# Patient Record
Sex: Female | Born: 1970 | Race: Black or African American | Hispanic: No | Marital: Married | Smoking: Never smoker
Health system: Southern US, Community
[De-identification: ages and names within clinical notes are randomized; demographics above are authoritative.]

## PROBLEM LIST (undated history)

## (undated) DIAGNOSIS — D649 Anemia, unspecified: Secondary | ICD-10-CM

## (undated) DIAGNOSIS — D219 Benign neoplasm of connective and other soft tissue, unspecified: Secondary | ICD-10-CM

## (undated) HISTORY — PX: BREAST SURGERY: SHX581

## (undated) HISTORY — DX: Anemia, unspecified: D64.9

## (undated) HISTORY — PX: BREAST EXCISIONAL BIOPSY: SUR124

## (undated) HISTORY — DX: Benign neoplasm of connective and other soft tissue, unspecified: D21.9

---

## 2015-12-23 DIAGNOSIS — D259 Leiomyoma of uterus, unspecified: Secondary | ICD-10-CM | POA: Diagnosis not present

## 2015-12-31 DIAGNOSIS — N888 Other specified noninflammatory disorders of cervix uteri: Secondary | ICD-10-CM | POA: Diagnosis not present

## 2015-12-31 DIAGNOSIS — N852 Hypertrophy of uterus: Secondary | ICD-10-CM | POA: Diagnosis not present

## 2015-12-31 DIAGNOSIS — D259 Leiomyoma of uterus, unspecified: Secondary | ICD-10-CM | POA: Diagnosis not present

## 2016-01-20 ENCOUNTER — Ambulatory Visit (INDEPENDENT_AMBULATORY_CARE_PROVIDER_SITE_OTHER): Payer: BLUE CROSS/BLUE SHIELD | Admitting: Obstetrics & Gynecology

## 2016-01-20 ENCOUNTER — Encounter: Payer: Self-pay | Admitting: Obstetrics & Gynecology

## 2016-01-20 VITALS — BP 126/70 | HR 66 | Resp 14 | Ht 65.0 in | Wt 197.0 lb

## 2016-01-20 DIAGNOSIS — N852 Hypertrophy of uterus: Secondary | ICD-10-CM

## 2016-01-20 DIAGNOSIS — Z01419 Encounter for gynecological examination (general) (routine) without abnormal findings: Secondary | ICD-10-CM

## 2016-01-20 DIAGNOSIS — Z124 Encounter for screening for malignant neoplasm of cervix: Secondary | ICD-10-CM | POA: Diagnosis not present

## 2016-01-20 DIAGNOSIS — R87610 Atypical squamous cells of undetermined significance on cytologic smear of cervix (ASC-US): Secondary | ICD-10-CM | POA: Diagnosis not present

## 2016-01-20 NOTE — Progress Notes (Signed)
45 y.o. [id number] MarriedAfrican AmericanF here for new patient exam.  H/O enlarged uterine fibroids.  Pt has hx of smaller fibroids since at least 2006 but over the last few years, there has been significant change and growth.  Pt reports the last two years she has really noticed changes.  Patient's last menstrual period was 01/09/2016.          Sexually active: Yes.    The current method of family planning is none.  Pt has not been SA since before her last menstrual cycle. Exercising: No.  The patient does not participate in regular exercise at present. Smoker:  no  Health Maintenance: Pap:  ~ 2015 Normal  History of abnormal Pap:  no MMG:  11/2013 Dense breast. F/u normal  Colonoscopy:  Never BMD:   Never TDaP:  2009 Screening Labs: PCP, Urine today:    reports that she has never smoked. She has never used smokeless tobacco. She reports that she drinks about 1.2 oz of alcohol per week. She reports that she does not use illicit drugs.  Past Medical History  Diagnosis Date  . Anemia   . Fibroid    Past Surg Hx: Breast fibroma removal x 2  Current Outpatient Prescriptions  Medication Sig Dispense Refill  . Dextromethorphan Polistirex (DELSYM PO) Take by mouth daily as needed.    . loratadine (CLARITIN) 10 MG tablet Take 10 mg by mouth daily.    . Multiple Vitamin (MULTIVITAMIN) tablet Take 1 tablet by mouth daily.     No current facility-administered medications for this visit.    Family History  Problem Relation Age of Onset  . Hypertension Father   . Prostate cancer Father   . Breast cancer Maternal Aunt   . Breast cancer Paternal Aunt   . Diabetes Maternal Grandmother   . Cancer Maternal Aunt     ROS:  Pertinent items are noted in HPI.  Otherwise, a comprehensive ROS was negative.  Exam:   BP 126/70 mmHg  Pulse 66  Resp 14  Ht 5\' 5"  (1.651 m)  Wt 197 lb (89.359 kg)  BMI 32.78 kg/m2  LMP 01/09/2016    Height: 5\' 5"  (165.1 cm)  Ht Readings from Last 3 Encounters:   01/20/16 5\' 5"  (1.651 m)    General appearance: alert, cooperative and appears stated age Head: Normocephalic, without obvious abnormality, atraumatic Neck: no adenopathy, supple, symmetrical, trachea midline and thyroid normal to inspection and palpation Lungs: clear to auscultation bilaterally Breasts: normal appearance, no masses or tenderness Heart: regular rate and rhythm Abdomen: soft, non-tender; bowel sounds normal; no masses,  no organomegaly Extremities: extremities normal, atraumatic, no cyanosis or edema Skin: Skin color, texture, turgor normal. No rashes or lesions Lymph nodes: Cervical, supraclavicular, and axillary nodes normal. No abnormal inguinal nodes palpated Neurologic: Grossly normal   Pelvic: External genitalia:  no lesions              Urethra:  normal appearing urethra with no masses, tenderness or lesions              Bartholins and Skenes: normal                 Vagina: normal appearing vagina with normal color and discharge, no lesions              Cervix: no lesions              Pap taken: Yes.   Bimanual Exam:  Uterus:  enlarged, 20-22 weeks  with about 15cm fibroid noted weeks size              Adnexa: normal adnexa and no mass, fullness, tenderness               Rectovaginal: Confirms               Anus:  normal sphincter tone, no lesions  Endometrial biopsy recommended.  Verbal consent obtained.  Procedure:  Speculum placed.  Cervix visualized and cleansed with betadine prep.  A single toothed tenaculum was applied to the anterior lip of the cervix.  Endometrial pipelle was advanced through the cervix into the endometrial cavity without difficulty.  Pipelle passed to 11cm.  Suction applied and pipelle removed with good tissue sample obtained.  Tenculum removed.  No bleeding noted.  Patient tolerated procedure well.  Chaperone was present for exam.  A:  Well Woman with normal exam Enlarged uterus, consistent with fibroids, symptomatic H/o breast  fibroma  P:   Mammogram information provided pap smear with HR HPV obtained today Endometrial biopsy pending Referral to IR for [place].  Pt will need MRI and is aware of this.  Ready to proceed. Release of records for ultrasound result and most recent Pap signed today. return annually or prn  In addition to exam, about 15 additional minutes spent discussing options and evaluation.

## 2016-01-24 ENCOUNTER — Telehealth: Payer: Self-pay | Admitting: Obstetrics & Gynecology

## 2016-01-24 ENCOUNTER — Other Ambulatory Visit: Payer: Self-pay | Admitting: Obstetrics & Gynecology

## 2016-01-24 DIAGNOSIS — N852 Hypertrophy of uterus: Secondary | ICD-10-CM

## 2016-01-24 DIAGNOSIS — D259 Leiomyoma of uterus, unspecified: Secondary | ICD-10-CM

## 2016-01-24 NOTE — Telephone Encounter (Signed)
Left patient a voicemail regarding referral. Note in referral and inbox to [place] with information.

## 2016-01-25 NOTE — Telephone Encounter (Signed)
Patient returned call. Patient is agreeable to seeing Dr Kathlene Cote at his first available appointment. Advised patient I will notify Jocelyn Lamer at [facility] and Jocelyn Lamer will contact her to schedule appointment.

## 2016-01-26 LAB — IPS PAP TEST WITH HPV

## 2016-01-27 ENCOUNTER — Telehealth: Payer: Self-pay | Admitting: Emergency Medicine

## 2016-01-27 NOTE — Telephone Encounter (Signed)
Patient notified of results and verbalized understanding.  Has appointment with [facility] imaging 02/22/16.  Will follow up as scheduled.  Routing to provider for final review. Patient agreeable to disposition. Will close encounter.

## 2016-01-27 NOTE — Telephone Encounter (Signed)
-----   Message from Megan Salon, MD sent at 01/25/2016 12:58 PM EDT ----- Please inform pt endometrial biopsy was negative for abnormal cells.

## 2016-02-03 NOTE — Telephone Encounter (Signed)
Debbie Bond returned call. She no longer needs a copy of a pap smear, most recent results will suffice.  She does not radiologist report for pelvic ultrasound that as completed 12/31/15 at [facility].  Call to [facility] [place] and left messgage with medical records requesting radiology report to be faxed to our office.

## 2016-02-03 NOTE — Telephone Encounter (Signed)
Debbie Bond with [facility] calling to get a copy of patient's ultrasound report and pap smear from her previous providers office.

## 2016-02-03 NOTE — Telephone Encounter (Signed)
Call to [facility]. There is a recent pap smear from her office visit that is in [place].  Also, ultrasound that was completed 01/2016 is scanned under media tab.  Left message to see if these records will suffice.   Also, call to patient previous provider office, advised that some records were received but need pap smear result if they are available. Left message with request that pap results be faxed.

## 2016-02-03 NOTE — Telephone Encounter (Signed)
[facility] returning call. Patient's last pap was in 2015 which is the one that was faxed to our office.

## 2016-02-16 ENCOUNTER — Other Ambulatory Visit (HOSPITAL_COMMUNITY): Payer: Self-pay | Admitting: Interventional Radiology

## 2016-02-16 ENCOUNTER — Ambulatory Visit
Admission: RE | Admit: 2016-02-16 | Discharge: 2016-02-16 | Disposition: A | Discharge status: Home / Self Care | Payer: BLUE CROSS/BLUE SHIELD | Source: Ambulatory Visit | Attending: Obstetrics & Gynecology | Admitting: Obstetrics & Gynecology

## 2016-02-16 DIAGNOSIS — D259 Leiomyoma of uterus, unspecified: Secondary | ICD-10-CM | POA: Diagnosis not present

## 2016-02-16 DIAGNOSIS — N852 Hypertrophy of uterus: Secondary | ICD-10-CM

## 2016-02-16 HISTORY — PX: IR GENERIC HISTORICAL: IMG1180011

## 2016-02-16 NOTE — Consult Note (Signed)
Chief Complaint: Patient was seen in consultation today for uterine fibroid embolization at the request of Miller,Mary S.  Referring Physician(s): Miller,Mary S  History of Present Illness: Debbie Bond is a 45 y.o. [id number] African American female with a history of known uterine fibroids since approximately 2006-2007. She states that ultrasound at that time showed roughly 2 small fibroids. She has had vaginal increase in uterine size and fibroids, especially noticeable over the last 7 months. She states that her lower abdomen has become more protuberant due to fibroid enlargement. Symptoms related to fibroid disease include menorrhagia and dysmenorrhea. She has regular periods roughly every 28 days that last 3-4 days with 2-3 days of heavy bleeding and rarely some clot passage. She has not been significantly anemic. She does not have any interperiod bleeding. Last menstrual cycle was 02/06/2016.  Bulk symptoms are also present with increased urinary frequency, urgency and nocturia. She voids many times during the day and wakes up approximately twice a night to void. She also has abdominal and pelvic pressure symptoms as well as bloating. She has not had any previous fibroid surgery and is not on any hormonal therapy. She has no history of gynecologic infections. Endometrial biopsy on 01/20/2016 showed no evidence of hyperplasia or malignancy. Pap smear on 01/20/2016 demonstrated atypical squamous cells of undetermined significance. No high risk HPV detected.  Mrs. Debbie Bond is not interested in future childbirth. She works full time as a Proofreader.  Past Medical History  Diagnosis Date  . Anemia   . Fibroid     Past Surgical History  Procedure Laterality Date  . Breast surgery Left 2003, 2005    Removal of fibroma    Allergies: Latex and Penicillins  Medications: Prior to Admission medications   Medication Sig Start Date End Date Taking?  Authorizing Provider  Dextromethorphan Polistirex (DELSYM PO) Take by mouth daily as needed.    Historical Provider, MD  loratadine (CLARITIN) 10 MG tablet Take 10 mg by mouth daily.    Historical Provider, MD  Multiple Vitamin (MULTIVITAMIN) tablet Take 1 tablet by mouth daily.    Historical Provider, MD     Family History  Problem Relation Age of Onset  . Hypertension Father   . Prostate cancer Father   . Breast cancer Maternal Aunt   . Breast cancer Paternal Aunt   . Diabetes Maternal Grandmother   . Cancer Maternal Aunt     Social History   Social History  . Marital Status: Married    Spouse Name: N/A  . Number of Children: N/A  . Years of Education: N/A   Social History Main Topics  . Smoking status: Never Smoker   . Smokeless tobacco: Never Used  . Alcohol Use: 1.2 oz/week    2 Glasses of wine per week  . Drug Use: No  . Sexual Activity: Yes    Birth Control/ Protection: None   Other Topics Concern  . Not on file   Social History Narrative  . No narrative on file    Review of Systems: A 12 point ROS discussed and pertinent positives are indicated in the HPI above.  All other systems are negative.  Review of Systems  Constitutional: Negative.   HENT: Negative.   Respiratory: Negative.   Cardiovascular: Negative.   Gastrointestinal: Positive for abdominal distention. Negative for nausea, vomiting, abdominal pain, diarrhea, constipation, blood in stool, anal bleeding and rectal pain.  Endocrine: Negative.  Genitourinary: Positive for urgency, frequency, menstrual problem and pelvic pain. Negative for dysuria, hematuria, flank pain, decreased urine volume, vaginal discharge, enuresis, difficulty urinating, genital sores, vaginal pain and dyspareunia.  Musculoskeletal: Negative.   Skin: Negative.   Neurological: Negative.   Hematological: Negative.     Vital Signs: BP 131/85 mmHg  Pulse 82  Temp(Src) 97.5 F (36.4 C) (Oral)  Resp 14  Ht 5' 5"  (1.651 m)   Wt 197 lb (89.359 kg)  BMI 32.78 kg/m2  SpO2 98%  LMP 02/06/2016 (Exact Date)  Physical Exam  Constitutional: She appears well-developed and well-nourished. No distress.  HENT:  Head: Normocephalic and atraumatic.  Neck: Neck supple. No JVD present. No tracheal deviation present. No thyromegaly present.  Cardiovascular: Normal rate, regular rhythm, normal heart sounds and intact distal pulses.  Exam reveals no gallop and no friction rub.   No murmur heard. Pulmonary/Chest: Effort normal and breath sounds normal. No stridor. No respiratory distress. She has no wheezes. She has no rales.  Abdominal: Soft. Bowel sounds are normal. There is no tenderness. There is no rebound and no guarding.  Palpable enlarged uterus with focal palpable anterior just below the umbilicus.  Musculoskeletal: She exhibits no edema or tenderness.  Lymphadenopathy:    She has no cervical adenopathy.  Neurological: She is alert.  Skin: Skin is warm and dry. No rash noted. She is not diaphoretic. No erythema.  Psychiatric: She has a normal mood and affect.  Nursing note and vitals reviewed.    Imaging: Copied images from a prior pelvic ultrasound at [employer] [facility] [employer] dated 12/31/2015 were reviewed. This demonstrates uterine enlargement with estimated dimensions of approximately 19.4 x 10.2 x 15.5 cm (estimated volume of 1595 mL). A dominant fundal fibroid was measured at 11.9 x 9.0 x 11.6 cm. There appear to be other smaller fibroids.  Image quality of copied images is poor and the report from the exam is not available.  Labs:  CBC: No results for input(s): WBC, HGB, HCT, PLT in the last 8760 hours.  COAGS: No results for input(s): INR, APTT in the last 8760 hours.  BMP: No results for input(s): NA, K, CL, CO2, GLUCOSE, BUN, CALCIUM, CREATININE, GFRNONAA, GFRAA in the last 8760 hours.  Invalid input(s): CMP  LIVER FUNCTION TESTS: No results for input(s): BILITOT, AST, ALT, ALKPHOS, PROT,  ALBUMIN in the last 8760 hours.  Assessment and Plan:  I met with Mrs. Debbie Bond. We reviewed prior ultrasound findings and physical exam findings which show an enlarged, 20-22 week uterus with a palpable dominant fundal fibroid. Uterine volume has enlarged over time and the patient has significant bulk symptoms and menorrhagia.  There is currently a clinical indication for hysterectomy. The patient wanted to discuss the possibility of uterine fibroid embolization as she has had some friends in [place] have the procedure performed with good results.  Alternatives to embolization were also discussed with the patient including hysterectomy.   I reviewed details of uterine fibroid embolization including technical details, outcomes and risks. We discussed the need for pre-procedural MRI evaluation of the pelvis with gadolinium in order to more accurately delineate fibroid morphology and enhancement pattern. Fibroid morphology would determine if the patient is a good candidate for embolization. The patient is attracted to embolization due to her job and shorter recovery time over the summer. If she is a candidate, she would like to target late June to have the procedure done as she will be finishing a project at that time.  Outpatient  MRI will be arranged. Mrs. Yomara Toothman is interested in meeting back with me to review the MRI images in person.  Once the MRI is reviewed, we will begin the scheduling and authorization process if she is a candidate for embolization and would like to proceed. The procedure is performed [facility] with overnight observation afterwards.  Thank you for this interesting consult.  I greatly enjoyed meeting Lavana Huckeba and look forward to participating in their care.  A copy of this report was sent to the requesting provider on this date.   Electronically SignedAletta Edouard T 02/16/2016, 11:38 AM   I spent a total of 40 Minutes in face to face  in clinical consultation, greater than 50% of which was counseling/coordinating care for symptomatic uterine fibroids.

## 2016-02-18 ENCOUNTER — Ambulatory Visit (HOSPITAL_COMMUNITY)
Admission: RE | Admit: 2016-02-18 | Discharge: 2016-02-18 | Disposition: A | Discharge status: Home / Self Care | Payer: BLUE CROSS/BLUE SHIELD | Source: Ambulatory Visit | Attending: Interventional Radiology | Admitting: Interventional Radiology

## 2016-02-18 DIAGNOSIS — D251 Intramural leiomyoma of uterus: Secondary | ICD-10-CM | POA: Insufficient documentation

## 2016-02-18 DIAGNOSIS — D259 Leiomyoma of uterus, unspecified: Secondary | ICD-10-CM | POA: Diagnosis not present

## 2016-02-18 MED ORDER — GADOBENATE DIMEGLUMINE 529 MG/ML IV SOLN
20.0000 mL | Freq: Once | INTRAVENOUS | Status: AC | PRN
  Administered 2016-02-18: 17 mL via INTRAVENOUS

## 2016-02-21 NOTE — Telephone Encounter (Signed)
Imaging report is scanned into EPIC.  Will close encounter.

## 2016-02-22 ENCOUNTER — Other Ambulatory Visit: Payer: Self-pay

## 2016-02-22 ENCOUNTER — Ambulatory Visit (HOSPITAL_COMMUNITY): Payer: BLUE CROSS/BLUE SHIELD

## 2016-02-22 ENCOUNTER — Other Ambulatory Visit: Payer: BLUE CROSS/BLUE SHIELD

## 2016-03-15 ENCOUNTER — Ambulatory Visit
Admission: RE | Admit: 2016-03-15 | Discharge: 2016-03-15 | Disposition: A | Discharge status: Home / Self Care | Payer: BLUE CROSS/BLUE SHIELD | Source: Ambulatory Visit | Attending: Interventional Radiology | Admitting: Interventional Radiology

## 2016-03-15 DIAGNOSIS — D259 Leiomyoma of uterus, unspecified: Secondary | ICD-10-CM

## 2016-03-15 NOTE — Progress Notes (Signed)
Chief Complaint: Symptomatically uterine fibroids.  History of Present Illness: Debbie Bond is a 45 y.o. female seen in consultation on 02/16/2016 for possible uterine fibroid embolization. Since that time, MRI of the pelvis has been performed on 02/17/2017 and she returns to review imaging and discuss possible embolization.  Past Medical History  Diagnosis Date  . Anemia   . Fibroid     Past Surgical History  Procedure Laterality Date  . Breast surgery Left 2003, 2005    Removal of fibroma    Allergies: Latex and Penicillins  Medications: Prior to Admission medications   Medication Sig Start Date End Date Taking? Authorizing Provider  Dextromethorphan Polistirex (DELSYM PO) Take by mouth daily as needed. Reported on 03/15/2016   Yes Historical Provider, MD  loratadine (CLARITIN) 10 MG tablet Take 10 mg by mouth daily.   Yes Historical Provider, MD  Multiple Vitamin (MULTIVITAMIN) tablet Take 1 tablet by mouth daily.   Yes Historical Provider, MD     Family History  Problem Relation Age of Onset  . Hypertension Father   . Prostate cancer Father   . Breast cancer Maternal Aunt   . Breast cancer Paternal Aunt   . Diabetes Maternal Grandmother   . Cancer Maternal Aunt     Social History   Social History  . Marital Status: Married    Spouse Name: N/A  . Number of Children: N/A  . Years of Education: N/A   Social History Main Topics  . Smoking status: Never Smoker   . Smokeless tobacco: Never Used  . Alcohol Use: 1.2 oz/week    2 Glasses of wine per week  . Drug Use: No  . Sexual Activity: Yes    Birth Control/ Protection: None   Other Topics Concern  . Not on file   Social History Narrative  . No narrative on file     Review of Systems  Vital Signs: BP 131/85 mmHg  Pulse 80  Temp(Src) 98.2 F (36.8 C) (Oral)  Resp 14  Wt 195 lb (88.451 kg)  SpO2 100%  LMP 03/04/2016 (Exact Date)  Physical Exam   Imaging: Mr Pelvis W Wo  Contrast  02/18/2016  CLINICAL DATA:  Pre uterine artery embolization. Lower abdominal was from protuberant. EXAM: MRI PELVIS WITHOUT AND WITH CONTRAST TECHNIQUE: Multiplanar multisequence MR imaging of the pelvis was performed both before and after administration of intravenous contrast. CONTRAST:  78m MULTIHANCE GADOBENATE DIMEGLUMINE 529 MG/ML IV SOLN COMPARISON:  None. FINDINGS: Uterus: Including the large exophytic leiomyoma the TOTAL URINE VOLUME EQUALS 19.1 X 13.7 x 9.6 cm. Excluding the large exophytic leiomyoma extending from the fundus, the uterus measures 6.2 by 8.3 by 5.5 cm. The junctional zone is normal thickness at 7 mm. Dominant exophytic leiomyoma extending from the uterine fundus measuring 13 cm in craniocaudad dimension and 13.7 x 9.6 cm in axial dimension (image 12, series 6). He dominant exophytic leiomyoma enhances uniformly and extends above the level of the umbilicus. Smaller intramural leiomyomas are present in the uterine body ranging in size from 1.72 to 2.5 cm and numbering 4. No submucosal leiomyomas. Additional exophytic leiomyoma extends from anterior LEFT uterine body measuring 5.3 by 3.7 by 4.6 cm (image 19, series 4). These additional intramural and exophytic leiomyomas enhance uniformly. Endometrium: Normal thickness at 6 mm. Cervix/Vagina: Normal Right Ovary: Normal size with small follicles measuring 3.6 cm in greatest dimension. Left Ovary: Normal size with small follicles measuring 3.7 cm in greatest dimension. Other: The bladder  and rectum are normal. Vascular: Normal arterial vascular anatomy to the ovaries and uterus. IMPRESSION: 1. Dominant exophytic leiomyoma extending from the uterine fundus measures up to 13 cm and extends above the umbilicus. Leiomyoma enhances uniformly. 2. Additional smaller exophytic leiomyoma multiple intramural leiomyomas. 3. Normal junctional zone. 4. No variant arterial anatomy identified . Electronically Signed   By: Suzy Bouchard M.D.   On:  02/18/2016 08:48    Assessment and Plan:  I met with Mrs. Debbie Bond to review MRI findings. This shows significant uterine enlargement with uterine dimensions by my measurement of approximately 20.7 x 9.7 x 14.5 cm (uterine volume of approximately 1514 mL). A dominant fibroid emanating from the top of the uterine fundus measures approximately 13.7 x 9.7 x 13.8 cm. This shows attachment to the top of the uterine fundus. Second largest fibroid is an anterior fibroid measuring approximately 5.4 cm in greatest diameter. There are at least an additional 6 smaller fibroids present.  Fibroid morphology appears amenable to embolization. I did tell Mrs. Debbie Bond that post embolization inflammation with associated cramping pain could be more severe given the large volume of fibroid tissue present. This can be aggressively treated with anti-inflammatory medication and pain medicine during post procedural hospitalization. I would still anticipate that she would be able to go home the day after the procedure. After discussion, Mrs. Debbie Bond would like to proceed with scheduling fibroid embolization. We will begin the scheduling process.    Electronically SignedAletta Edouard T 03/15/2016, 1:29 PM   I spent a total of 15 Minutes in face to face in clinical consultation, greater than 50% of which was counseling/coordinating care for fibroid embolization.

## 2016-03-17 ENCOUNTER — Other Ambulatory Visit: Payer: Self-pay | Admitting: Interventional Radiology

## 2016-03-17 DIAGNOSIS — D259 Leiomyoma of uterus, unspecified: Secondary | ICD-10-CM

## 2016-04-05 ENCOUNTER — Ambulatory Visit (HOSPITAL_COMMUNITY): Payer: BLUE CROSS/BLUE SHIELD

## 2016-04-05 ENCOUNTER — Other Ambulatory Visit (HOSPITAL_COMMUNITY): Payer: BLUE CROSS/BLUE SHIELD

## 2016-05-03 ENCOUNTER — Other Ambulatory Visit: Payer: Self-pay | Admitting: Radiology

## 2016-05-04 ENCOUNTER — Other Ambulatory Visit: Payer: Self-pay | Admitting: General Surgery

## 2016-05-04 ENCOUNTER — Other Ambulatory Visit: Payer: Self-pay | Admitting: Radiology

## 2016-05-05 ENCOUNTER — Other Ambulatory Visit: Payer: Self-pay | Admitting: Interventional Radiology

## 2016-05-05 ENCOUNTER — Ambulatory Visit (HOSPITAL_COMMUNITY)
Admission: RE | Admit: 2016-05-05 | Discharge: 2016-05-05 | Disposition: A | Discharge status: Home / Self Care | Payer: BLUE CROSS/BLUE SHIELD | Source: Ambulatory Visit | Attending: Interventional Radiology | Admitting: Interventional Radiology

## 2016-05-05 ENCOUNTER — Observation Stay (HOSPITAL_COMMUNITY)
Admission: RE | Admit: 2016-05-05 | Discharge: 2016-05-06 | Disposition: A | Discharge status: Home / Self Care | Payer: BLUE CROSS/BLUE SHIELD | Source: Ambulatory Visit | Attending: Interventional Radiology | Admitting: Interventional Radiology

## 2016-05-05 ENCOUNTER — Encounter (HOSPITAL_COMMUNITY): Payer: Self-pay

## 2016-05-05 DIAGNOSIS — R102 Pelvic and perineal pain: Secondary | ICD-10-CM | POA: Diagnosis not present

## 2016-05-05 DIAGNOSIS — D259 Leiomyoma of uterus, unspecified: Secondary | ICD-10-CM

## 2016-05-05 DIAGNOSIS — Z79899 Other long term (current) drug therapy: Secondary | ICD-10-CM | POA: Diagnosis not present

## 2016-05-05 DIAGNOSIS — Z5181 Encounter for therapeutic drug level monitoring: Secondary | ICD-10-CM | POA: Diagnosis not present

## 2016-05-05 DIAGNOSIS — Z791 Long term (current) use of non-steroidal anti-inflammatories (NSAID): Secondary | ICD-10-CM | POA: Diagnosis not present

## 2016-05-05 HISTORY — PX: IR GENERIC HISTORICAL: IMG1180011

## 2016-05-05 LAB — BASIC METABOLIC PANEL
Anion gap: 7 (ref 5–15)
BUN: 17 mg/dL (ref 6–20)
CALCIUM: 9.6 mg/dL (ref 8.9–10.3)
CO2: 25 mmol/L (ref 22–32)
CREATININE: 0.99 mg/dL (ref 0.44–1.00)
Chloride: 106 mmol/L (ref 101–111)
GFR calc Af Amer: >60 mL/min (ref >60)
Glucose, Bld: 88 mg/dL (ref 65–99)
Potassium: 3.6 mmol/L (ref 3.5–5.1)
Sodium: 138 mmol/L (ref 135–145)

## 2016-05-05 LAB — CBC WITH DIFFERENTIAL/PLATELET
BASOS ABS: 0 10*3/uL (ref 0.0–0.1)
BASOS PCT: 0 %
EOS PCT: 1 %
Eosinophils Absolute: 0.1 10*3/uL (ref 0.0–0.7)
HCT: 39.8 % (ref 36.0–46.0)
Hemoglobin: 13.5 g/dL (ref 12.0–15.0)
Lymphocytes Relative: 45 %
Lymphs Abs: 2.1 10*3/uL (ref 0.7–4.0)
MCH: 26.7 pg (ref 26.0–34.0)
MCHC: 33.9 g/dL (ref 30.0–36.0)
MCV: 78.8 fL (ref 78.0–100.0)
MONO ABS: 0.5 10*3/uL (ref 0.1–1.0)
MONOS PCT: 10 %
Neutro Abs: 2.1 10*3/uL (ref 1.7–7.7)
Neutrophils Relative %: 44 %
PLATELETS: 247 10*3/uL (ref 150–400)
RBC: 5.05 MIL/uL (ref 3.87–5.11)
RDW: 14.2 % (ref 11.5–15.5)
WBC: 4.7 10*3/uL (ref 4.0–10.5)

## 2016-05-05 LAB — PROTIME-INR
INR: 0.91
PROTHROMBIN TIME: 12.3 s (ref 11.4–15.2)

## 2016-05-05 LAB — APTT: APTT: 36 s (ref 24–36)

## 2016-05-05 LAB — HCG, SERUM, QUALITATIVE: Preg, Serum: NEGATIVE

## 2016-05-05 MED ORDER — MIDAZOLAM HCL 2 MG/2ML IJ SOLN
INTRAMUSCULAR | Status: AC | PRN
  Administered 2016-05-05: 1 mg via INTRAVENOUS
  Administered 2016-05-05: 0.5 mg via INTRAVENOUS
  Administered 2016-05-05 (×5): 1 mg via INTRAVENOUS
  Administered 2016-05-05: 0.5 mg via INTRAVENOUS

## 2016-05-05 MED ORDER — HYDROMORPHONE HCL 1 MG/ML IJ SOLN: 1.0000 mg | INTRAMUSCULAR | Status: DC | PRN

## 2016-05-05 MED ORDER — DIPHENHYDRAMINE HCL 50 MG/ML IJ SOLN: 12.5000 mg | Freq: Four times a day (QID) | INTRAMUSCULAR | Status: DC | PRN

## 2016-05-05 MED ORDER — SODIUM CHLORIDE 0.9 % IV SOLN
INTRAVENOUS | Status: DC
  Administered 2016-05-05: 15:00:00 via INTRAVENOUS

## 2016-05-05 MED ORDER — ONDANSETRON HCL 4 MG/2ML IJ SOLN
4.0000 mg | Freq: Four times a day (QID) | INTRAMUSCULAR | Status: DC | PRN
  Filled 2016-05-05: qty 2

## 2016-05-05 MED ORDER — MIDAZOLAM HCL 2 MG/2ML IJ SOLN
INTRAMUSCULAR | Status: AC
  Filled 2016-05-05: qty 4

## 2016-05-05 MED ORDER — SODIUM CHLORIDE 0.9% FLUSH: 3.0000 mL | INTRAVENOUS | Status: DC | PRN

## 2016-05-05 MED ORDER — SODIUM CHLORIDE 0.9% FLUSH
3.0000 mL | Freq: Two times a day (BID) | INTRAVENOUS | Status: DC
  Administered 2016-05-05: 3 mL via INTRAVENOUS

## 2016-05-05 MED ORDER — KETOROLAC TROMETHAMINE 30 MG/ML IJ SOLN
30.0000 mg | Freq: Four times a day (QID) | INTRAMUSCULAR | Status: DC
  Administered 2016-05-05 – 2016-05-06 (×3): 30 mg via INTRAVENOUS
  Filled 2016-05-05 (×3): qty 1

## 2016-05-05 MED ORDER — PROMETHAZINE HCL 25 MG PO TABS: 25.0000 mg | ORAL_TABLET | Freq: Three times a day (TID) | ORAL | Status: DC | PRN

## 2016-05-05 MED ORDER — KETOROLAC TROMETHAMINE 30 MG/ML IJ SOLN
INTRAMUSCULAR | Status: AC
  Filled 2016-05-05: qty 1

## 2016-05-05 MED ORDER — DOCUSATE SODIUM 100 MG PO CAPS
100.0000 mg | ORAL_CAPSULE | Freq: Two times a day (BID) | ORAL | Status: DC
  Administered 2016-05-06: 100 mg via ORAL
  Filled 2016-05-05 (×2): qty 1

## 2016-05-05 MED ORDER — ONDANSETRON HCL 4 MG/2ML IJ SOLN
4.0000 mg | Freq: Four times a day (QID) | INTRAMUSCULAR | Status: DC | PRN
  Administered 2016-05-05 (×2): 4 mg via INTRAVENOUS
  Filled 2016-05-05: qty 2

## 2016-05-05 MED ORDER — HYDROMORPHONE 1 MG/ML IV SOLN
INTRAVENOUS | Status: DC
  Administered 2016-05-05: 0.9 mg via INTRAVENOUS
  Administered 2016-05-05: 14:00:00 via INTRAVENOUS
  Administered 2016-05-05: 1.4 mg via INTRAVENOUS
  Administered 2016-05-06: 0.3 mg via INTRAVENOUS
  Administered 2016-05-06: 0.6 mg via INTRAVENOUS

## 2016-05-05 MED ORDER — IOPAMIDOL (ISOVUE-300) INJECTION 61%
250.0000 mL | Freq: Once | INTRAVENOUS | Status: AC | PRN
  Administered 2016-05-05: 100 mL via INTRAVENOUS

## 2016-05-05 MED ORDER — LIDOCAINE HCL 1 % IJ SOLN
INTRAMUSCULAR | Status: AC | PRN
  Administered 2016-05-05: 5 mL

## 2016-05-05 MED ORDER — SODIUM CHLORIDE 0.9 % IV SOLN: 250.0000 mL | INTRAVENOUS | Status: DC | PRN

## 2016-05-05 MED ORDER — VANCOMYCIN HCL IN DEXTROSE 1-5 GM/200ML-% IV SOLN
1000.0000 mg | INTRAVENOUS | Status: AC
  Administered 2016-05-05: 1000 mg via INTRAVENOUS
  Filled 2016-05-05: qty 200

## 2016-05-05 MED ORDER — FENTANYL CITRATE (PF) 100 MCG/2ML IJ SOLN
INTRAMUSCULAR | Status: AC | PRN
  Administered 2016-05-05: 25 ug via INTRAVENOUS
  Administered 2016-05-05 (×2): 50 ug via INTRAVENOUS
  Administered 2016-05-05: 25 ug via INTRAVENOUS

## 2016-05-05 MED ORDER — DIPHENHYDRAMINE HCL 12.5 MG/5ML PO ELIX: 12.5000 mg | ORAL_SOLUTION | Freq: Four times a day (QID) | ORAL | Status: DC | PRN

## 2016-05-05 MED ORDER — SODIUM CHLORIDE 0.9% FLUSH: 9.0000 mL | INTRAVENOUS | Status: DC | PRN

## 2016-05-05 MED ORDER — MIDAZOLAM HCL 2 MG/2ML IJ SOLN
INTRAMUSCULAR | Status: AC
  Filled 2016-05-05: qty 6

## 2016-05-05 MED ORDER — PROMETHAZINE HCL 25 MG RE SUPP: 25.0000 mg | Freq: Three times a day (TID) | RECTAL | Status: DC | PRN

## 2016-05-05 MED ORDER — FENTANYL CITRATE (PF) 100 MCG/2ML IJ SOLN
INTRAMUSCULAR | Status: AC
  Filled 2016-05-05: qty 6

## 2016-05-05 MED ORDER — SODIUM CHLORIDE 0.9 % IV SOLN
INTRAVENOUS | Status: DC
  Administered 2016-05-05: 08:00:00 via INTRAVENOUS

## 2016-05-05 MED ORDER — KETOROLAC TROMETHAMINE 30 MG/ML IJ SOLN
INTRAMUSCULAR | Status: AC | PRN
  Administered 2016-05-05: 30 mg via INTRAVENOUS

## 2016-05-05 MED ORDER — HYDROMORPHONE HCL 2 MG/ML IJ SOLN
INTRAMUSCULAR | Status: AC
  Administered 2016-05-05: 1 mg
  Filled 2016-05-05: qty 1

## 2016-05-05 MED ORDER — KETOROLAC TROMETHAMINE 30 MG/ML IJ SOLN: 30.0000 mg | Freq: Once | INTRAMUSCULAR | Status: DC

## 2016-05-05 MED ORDER — HYDROCODONE-ACETAMINOPHEN 5-325 MG PO TABS: 1.0000 | ORAL_TABLET | ORAL | Status: DC | PRN

## 2016-05-05 MED ORDER — NITROGLYCERIN 1 MG/10 ML FOR IR/CATH LAB
INTRA_ARTERIAL | Status: AC | PRN
  Administered 2016-05-05: 100 ug via INTRA_ARTERIAL

## 2016-05-05 MED ORDER — HYDROMORPHONE 1 MG/ML IV SOLN
INTRAVENOUS | Status: AC
  Filled 2016-05-05: qty 25

## 2016-05-05 MED ORDER — NALOXONE HCL 0.4 MG/ML IJ SOLN: 0.4000 mg | INTRAMUSCULAR | Status: DC | PRN

## 2016-05-05 MED ORDER — LIDOCAINE HCL 1 % IJ SOLN
INTRAMUSCULAR | Status: AC
  Filled 2016-05-05: qty 20

## 2016-05-05 NOTE — Procedures (Signed)
Interventional Radiology Procedure Note  Procedure:  Uterine Fibroid Embolization  Complications:  None  Estimated Blood Loss: 10 mL  Findings:  Extremely difficult left uterine artery catheterization.  Once catheter advanced, embolization performed with 2 vials of 500-700 micron Embospheres. No significant right uterine artery identified.  Plan:  Overnight observation  Eulas Post T. Kathlene Cote, M.D Pager:  [phone]

## 2016-05-05 NOTE — Discharge Instructions (Addendum)
Uterine Artery Embolization for Fibroids, Care After °Refer to this sheet in the next few weeks. These instructions provide you with information on caring for yourself after your procedure. Your health care provider may also give you more specific instructions. Your treatment has been planned according to current medical practices, but problems sometimes occur. Call your health care provider if you have any problems or questions after your procedure. °WHAT TO EXPECT AFTER THE PROCEDURE °After your procedure, it is typical to have cramping in the pelvis. You will be given pain medicine to control it. °HOME CARE INSTRUCTIONS °· Only take over-the-counter or prescription medicines for pain, discomfort, or fever as directed by your health care provider. °· Do not take aspirin. It can cause bleeding. °· Follow your health care provider's advice regarding medicines given to you, diet, activity, and when to begin sexual activity. °· See your health care provider for follow-up care as directed. °SEEK MEDICAL CARE IF: °· You have a fever. °· You have redness, swelling, and pain around your incision site. °· You have pus draining from your incision. °· You have a rash. °SEEK IMMEDIATE MEDICAL CARE IF: °· You have bleeding from your incision site. °· You have difficulty breathing. °· You have chest pain. °· You have severe abdominal pain. °· You have leg pain. °· You become dizzy and faint. °  °This information is not intended to replace advice given to you by your health care provider. Make sure you discuss any questions you have with your health care provider. °  °Document Released: 06/25/2013 Document Reviewed: 06/25/2013 °Elsevier Interactive Patient Education ©2016 [employer] ° °

## 2016-05-05 NOTE — Progress Notes (Signed)
Patient ID: Debbie Bond, female   DOB: Apr 02, 1971, 45 y.o.   MRN: [id number]    Referring Physician(s): Hale Bogus   Supervising Physician: [name]  Patient Status:  Inpatient  Chief Complaint:  Symptomatic uterine fibroids  Subjective: Patient doing fairly well; does have some minimal pelvic cramping and occasional nausea. Has had a few small sips of liquids but no food at this time.   Allergies: Latex and Penicillins  Medications: Prior to Admission medications   Medication Sig Start Date End Date Taking? Authorizing Provider  ibuprofen (ADVIL,MOTRIN) 200 MG tablet Take 400 mg by mouth every 6 (six) hours as needed for moderate pain.   Yes Historical Provider, MD  loratadine (CLARITIN) 10 MG tablet Take 10 mg by mouth daily.   Yes Historical Provider, MD     Vital Signs: BP (!) 129/95 (BP Location: Left Arm)   Pulse 68   Temp 98.1 F (36.7 C) (Oral)   Resp 11   Wt 195 lb 9.6 oz (88.7 kg)   LMP 04/23/2016   SpO2 100%   BMI 32.55 kg/m   Physical Exam, alert. Abdomen soft, minimal pelvic tenderness. Puncture site right common femoral artery soft, nontender, no hematoma. Intact distal pulses. Foley with yellow urine  Imaging: No results found.  Labs:  CBC:  Recent Labs  05/05/16 0755  WBC 4.7  HGB 13.5  HCT 39.8  PLT 247    COAGS:  Recent Labs  05/05/16 0755  INR 0.91  APTT 36    BMP:  Recent Labs  05/05/16 0755  NA 138  K 3.6  CL 106  CO2 25  GLUCOSE 88  BUN 17  CALCIUM 9.6  CREATININE 0.99  GFRNONAA >60  GFRAA >60    LIVER FUNCTION TESTS: No results for input(s): BILITOT, AST, ALT, ALKPHOS, PROT, ALBUMIN in the last 8760 hours.  Assessment and Plan: Patient with history of symptomatic uterine fibroids, status post left uterine artery embolization earlier today. No significant right uterine artery identified. For overnight observation. DC Foley later today. PCA Dilaudid for pain, antiemetics as needed.  Follow-up in IR clinic in 3-4 weeks with Dr. Kathlene Cote.  Electronically Signed: D. Rowe Robert 05/05/2016, 4:52 PM   I spent a total of 15 minutes at the the patient's bedside AND on the patient's hospital floor or unit, greater than 50% of which was counseling/coordinating care for uterine fibroid embolization

## 2016-05-05 NOTE — H&P (Signed)
Patient ID: Debbie Bond, female   DOB: 02-Mar-1971, 45 y.o.   MRN: [id number]    Referring Physician(s): Megan Salon  Supervising Physician: [name]  Patient Status:  Outpatient TBA  Chief Complaint:  Symptomatic uterine fibroids  Subjective: Pt familiar to IR service from recent consultation with Dr. Kathlene Cote on 02/16/16 to discuss treatment options for symptomatic uterine fibroids. She was deemed an appropriate candidate for bilateral uterine artery embolization and presents today for the procedure. She currently denies fever, headache, chest pain, dyspnea, abdominal pain, nausea, vomiting, hematuria/dysuria or bloody stools. She does have menorrhagia/dysmenorrhea, urinary frequency/urgency and nocturia, intermittent back pain/abdominal pressure/bloating. Past Medical History:  Diagnosis Date  . Anemia   . Fibroid    Past Surgical History:  Procedure Laterality Date  . BREAST SURGERY Left 2003, 2005   Removal of fibroma    Allergies: Latex and Penicillins  Medications: Prior to Admission medications   Medication Sig Start Date End Date Taking? Authorizing Provider  ibuprofen (ADVIL,MOTRIN) 200 MG tablet Take 200 mg by mouth every 6 (six) hours as needed.   Yes Historical Provider, MD  loratadine (CLARITIN) 10 MG tablet Take 10 mg by mouth daily.   Yes Historical Provider, MD  Multiple Vitamin (MULTIVITAMIN) tablet Take 1 tablet by mouth daily.   Yes Historical Provider, MD  Dextromethorphan Polistirex (DELSYM PO) Take by mouth daily as needed. Reported on 03/15/2016    Historical Provider, MD     Vital Signs: BP 117/85   Pulse 89   Temp 98.3 F (36.8 C) (Oral)   Resp 16   Wt 195 lb 9.6 oz (88.7 kg)   SpO2 100%   BMI 32.55 kg/m   Physical Exam patient awake, alert. Chest clear to auscultation bilaterally. Heart with regular rate and rhythm. Abdomen soft, positive bowel sounds, fibroid uterus, nontender; lower extremities with no edema and intact  distal pulses.  Imaging: No results found.  Labs:  CBC:  Recent Labs  05/05/16 0755  WBC 4.7  HGB 13.5  HCT 39.8  PLT 247    COAGS: No results for input(s): INR, APTT in the last 8760 hours.  BMP: No results for input(s): NA, K, CL, CO2, GLUCOSE, BUN, CALCIUM, CREATININE, GFRNONAA, GFRAA in the last 8760 hours.  Invalid input(s): CMP  LIVER FUNCTION TESTS: No results for input(s): BILITOT, AST, ALT, ALKPHOS, PROT, ALBUMIN in the last 8760 hours.  Assessment and Plan: Patient with history of symptomatic uterine fibroids. Seen recently in consultation by Dr. Kathlene Cote 02/16/16 and deemed an appropriate candidate for bilateral uterine artery embolization. She presents today for the procedure. Details/risks of procedure, including but not limited to, internal bleeding, infection, nontarget embolization, contrast nephropathy, tissue expulsion discussed with patient and family with their understanding and consent. Postprocedure she will be admitted for overnight observation for pain control.   Electronically Signed: D. Rowe Robert 05/05/2016, 8:20 AM   I spent a total of 30 minutes at the the patient's bedside AND on the patient's hospital floor or unit, greater than 50% of which was counseling/coordinating care for bilateral uterine artery embolization

## 2016-05-06 ENCOUNTER — Other Ambulatory Visit: Payer: Self-pay | Admitting: Radiology

## 2016-05-06 DIAGNOSIS — Z79899 Other long term (current) drug therapy: Secondary | ICD-10-CM | POA: Diagnosis not present

## 2016-05-06 DIAGNOSIS — D259 Leiomyoma of uterus, unspecified: Secondary | ICD-10-CM | POA: Diagnosis not present

## 2016-05-06 DIAGNOSIS — R102 Pelvic and perineal pain: Secondary | ICD-10-CM | POA: Diagnosis not present

## 2016-05-06 DIAGNOSIS — Z5181 Encounter for therapeutic drug level monitoring: Secondary | ICD-10-CM | POA: Diagnosis not present

## 2016-05-06 DIAGNOSIS — Z791 Long term (current) use of non-steroidal anti-inflammatories (NSAID): Secondary | ICD-10-CM | POA: Diagnosis not present

## 2016-05-06 LAB — CBC
HCT: 36.3 % (ref 36.0–46.0)
Hemoglobin: 12 g/dL (ref 12.0–15.0)
MCH: 26.4 pg (ref 26.0–34.0)
MCHC: 33.1 g/dL (ref 30.0–36.0)
MCV: 79.8 fL (ref 78.0–100.0)
PLATELETS: 237 10*3/uL (ref 150–400)
RBC: 4.55 MIL/uL (ref 3.87–5.11)
RDW: 14.3 % (ref 11.5–15.5)
WBC: 6.6 10*3/uL (ref 4.0–10.5)

## 2016-05-06 LAB — BASIC METABOLIC PANEL
Anion gap: 6 (ref 5–15)
BUN: 15 mg/dL (ref 6–20)
CALCIUM: 8.8 mg/dL — AB (ref 8.9–10.3)
CHLORIDE: 107 mmol/L (ref 101–111)
CO2: 24 mmol/L (ref 22–32)
CREATININE: 0.83 mg/dL (ref 0.44–1.00)
Glucose, Bld: 108 mg/dL — ABNORMAL HIGH (ref 65–99)
Potassium: 3.8 mmol/L (ref 3.5–5.1)
SODIUM: 137 mmol/L (ref 135–145)

## 2016-05-06 NOTE — Progress Notes (Signed)
Patient discharged to home, all discharge medications and instructions reviewed and questions answered. Patient to be assisted to vehicle by wheelchair when ride arrives.  

## 2016-05-06 NOTE — Discharge Summary (Signed)
Patient ID: Debbie Bond MRN: [id number] DOB/AGE: 45-Aug-1972 45 y.o.  Admit date: 05/05/2016 Discharge date: 05/06/2016  Supervising Physician: [name]  Admission Diagnoses: Symptomatic uterine fibroids  Discharge Diagnoses: Symptomatic uterine fibroids, status post left uterine artery embolization; no significant right uterine artery identified on 05/05/16 Active Problems:   Uterine leiomyoma   Fibroid, uterine  Past Medical History:  Diagnosis Date  . Anemia   . Fibroid    Past Surgical History:  Procedure Laterality Date  . BREAST SURGERY Left 2003, 2005   Removal of fibroma      Discharged Condition: good  Hospital Course: Debbie Bond is a 45 year old female who was referred to the IR service on 02/16/16 for consultation regarding treatment options for symptomatic uterine fibroids. She was deemed an appropriate candidate for bilateral uterine artery embolization. On 05/05/16 she underwent successful left uterine artery embolization which was extremely difficult to cannulate. There was no significant right uterine artery identified. She did receive substantial amounts of x-rays during procedure and was given instructions to monitor lower back for any skin changes and contact our service with any questions. The procedure was performed without immediate complications and she was subsequently admitted for overnight observation for pain control. Post procedure she did experience some mild intermittent pelvic cramping along with some occasional nausea and vomiting. She was placed on a Dilaudid PCA pump and given antiemetics as needed. On the day of discharge the patient was stable. She was able to void, ambulate and tolerate her diet without significant difficulty. Follow-up laboratory studies were stable. She was given prescriptions for ibuprofen 600 mg, #20, no refills, 1 tablet every 6 hours for the next 5 days, Colace 100 mg, #20, no refills, 1 tablet twice  daily as needed for constipation, Norco 5/325, #30, no refills, 1-2 tablets every 4-6 hours as needed for moderate to severe pain, Zofran 8 mg, #20, no refills, 1 tablet twice daily as needed for nausea. She will continue on her current home medications and follow up with Dr. Sabra Heck (gynecologist) as needed. She will be scheduled for follow-up visit in the IR clinic with Dr. Kathlene Cote in 3-4 weeks. She was told to contact our service in the interim with any additional questions or concerns.  Consults: none  Significant Diagnostic Studies:  Results for orders placed or performed during the hospital encounter of 05/05/16  APTT  Result Value Ref Range   aPTT 36 24 - 36 seconds  Basic metabolic panel  Result Value Ref Range   Sodium 138 135 - 145 mmol/L   Potassium 3.6 3.5 - 5.1 mmol/L   Chloride 106 101 - 111 mmol/L   CO2 25 22 - 32 mmol/L   Glucose, Bld 88 65 - 99 mg/dL   BUN 17 6 - 20 mg/dL   Creatinine, Ser 0.99 0.44 - 1.00 mg/dL   Calcium 9.6 8.9 - 10.3 mg/dL   GFR calc non Af Amer >60 >60 mL/min   GFR calc Af Amer >60 >60 mL/min   Anion gap 7 5 - 15  CBC with Differential/Platelet  Result Value Ref Range   WBC 4.7 4.0 - 10.5 K/uL   RBC 5.05 3.87 - 5.11 MIL/uL   Hemoglobin 13.5 12.0 - 15.0 g/dL   HCT 39.8 36.0 - 46.0 %   MCV 78.8 78.0 - 100.0 fL   MCH 26.7 26.0 - 34.0 pg   MCHC 33.9 30.0 - 36.0 g/dL   RDW 14.2 11.5 - 15.5 %   Platelets  247 150 - 400 K/uL   Neutrophils Relative % 44 %   Neutro Abs 2.1 1.7 - 7.7 K/uL   Lymphocytes Relative 45 %   Lymphs Abs 2.1 0.7 - 4.0 K/uL   Monocytes Relative 10 %   Monocytes Absolute 0.5 0.1 - 1.0 K/uL   Eosinophils Relative 1 %   Eosinophils Absolute 0.1 0.0 - 0.7 K/uL   Basophils Relative 0 %   Basophils Absolute 0.0 0.0 - 0.1 K/uL  hCG, serum, qualitative  Result Value Ref Range   Preg, Serum NEGATIVE NEGATIVE  Protime-INR  Result Value Ref Range   Prothrombin Time 12.3 11.4 - 15.2 seconds   INR 0.91   CBC  Result Value Ref  Range   WBC 6.6 4.0 - 10.5 K/uL   RBC 4.55 3.87 - 5.11 MIL/uL   Hemoglobin 12.0 12.0 - 15.0 g/dL   HCT 36.3 36.0 - 46.0 %   MCV 79.8 78.0 - 100.0 fL   MCH 26.4 26.0 - 34.0 pg   MCHC 33.1 30.0 - 36.0 g/dL   RDW 14.3 11.5 - 15.5 %   Platelets 237 150 - 400 K/uL  Basic metabolic panel  Result Value Ref Range   Sodium 137 135 - 145 mmol/L   Potassium 3.8 3.5 - 5.1 mmol/L   Chloride 107 101 - 111 mmol/L   CO2 24 22 - 32 mmol/L   Glucose, Bld 108 (H) 65 - 99 mg/dL   BUN 15 6 - 20 mg/dL   Creatinine, Ser 0.83 0.44 - 1.00 mg/dL   Calcium 8.8 (L) 8.9 - 10.3 mg/dL   GFR calc non Af Amer >60 >60 mL/min   GFR calc Af Amer >60 >60 mL/min   Anion gap 6 5 - 15     Treatments: Successful left uterine artery embolization on 05/05/16 via IV conscious sedation  Discharge Exam: Blood pressure 117/71, pulse 86, temperature 98.7 F (37.1 C), temperature source Oral, resp. rate 16, height 5\' 5"  (1.651 m), weight 195 lb (88.5 kg), last menstrual period 04/23/2016, SpO2 99 %. Patient awake, alert. Chest clear to auscultation bilaterally. Heart with regular rate and rhythm. Abdomen soft, fibroid uterus, positive bowel sounds, nontender. Puncture site right common femoral artery soft, clean, dry, nontender, no hematoma. Lower extremities with no edema and intact distal pulses.  Disposition: home  Discharge Instructions    Call MD for:  difficulty breathing, headache or visual disturbances    Complete by:  As directed   Call MD for:  extreme fatigue    Complete by:  As directed   Call MD for:  hives    Complete by:  As directed   Call MD for:  persistant dizziness or light-headedness    Complete by:  As directed   Call MD for:  persistant nausea and vomiting    Complete by:  As directed   Call MD for:  redness, tenderness, or signs of infection (pain, swelling, redness, odor or green/yellow discharge around incision site)    Complete by:  As directed   Call MD for:  severe uncontrolled pain    Complete  by:  As directed   Call MD for:  temperature >100.4    Complete by:  As directed   Change dressing (specify)    Complete by:  As directed   May change bandage over right groin and place Band-Aid to site daily for the next 2-3 days. May wash site with soap and water.   Diet - low sodium heart  healthy    Complete by:  As directed   Discharge instructions    Complete by:  As directed   Your procedure required the use of substantial amounts of x-rays. Radiation side effects are unlikely but possible. Please have the family member inspect your lower back for signs of redness or rash 2 weeks from today. Please call [phone] or [phone] with any questions or concerns.   Driving Restrictions    Complete by:  As directed   No driving for next 48 hours   Increase activity slowly    Complete by:  As directed   Lifting restrictions    Complete by:  As directed   No heavy lifting for the next 3-4 days   May shower / Bathe    Complete by:  As directed   May walk up steps    Complete by:  As directed   Sexual Activity Restrictions    Complete by:  As directed   No sexual intercourse for 1 week       Medication List    TAKE these medications   ibuprofen 200 MG tablet Commonly known as:  ADVIL,MOTRIN Take 400 mg by mouth every 6 (six) hours as needed for moderate pain.   loratadine 10 MG tablet Commonly known as:  CLARITIN Take 10 mg by mouth daily.      Follow-up Information    Azzie Roup, MD .   Specialty:  Interventional Radiology Why:  Radiology will call you with follow up appointment with Dr. Kathlene Cote in [facility] in 3-4 weeks. Please call [phone] or [phone] with any questions Contact information: [place] [address] [phone]        Lyman Speller, MD .   Specialty:  Gynecology Why:  Continue current gynecologic care with Dr. Sabra Heck as scheduled Contact information: [address] [place]  [address] [phone]            Electronically Signed: D. Rowe Robert 05/06/2016, 9:12 AM   I have spent less than 30 minutes discharging Debbie Bond.

## 2016-05-07 ENCOUNTER — Encounter (HOSPITAL_COMMUNITY): Payer: Self-pay | Admitting: Interventional Radiology

## 2016-06-06 ENCOUNTER — Other Ambulatory Visit: Payer: BLUE CROSS/BLUE SHIELD

## 2016-06-20 ENCOUNTER — Encounter: Payer: Self-pay | Admitting: Interventional Radiology

## 2016-06-28 ENCOUNTER — Other Ambulatory Visit: Payer: BLUE CROSS/BLUE SHIELD

## 2016-10-25 ENCOUNTER — Other Ambulatory Visit (HOSPITAL_COMMUNITY): Payer: Self-pay | Admitting: Interventional Radiology

## 2016-10-25 DIAGNOSIS — D259 Leiomyoma of uterus, unspecified: Secondary | ICD-10-CM

## 2016-11-23 ENCOUNTER — Encounter (HOSPITAL_COMMUNITY): Payer: Self-pay

## 2016-11-23 ENCOUNTER — Ambulatory Visit (HOSPITAL_COMMUNITY): Payer: BLUE CROSS/BLUE SHIELD

## 2016-11-23 ENCOUNTER — Other Ambulatory Visit: Payer: BLUE CROSS/BLUE SHIELD

## 2016-12-06 ENCOUNTER — Ambulatory Visit (HOSPITAL_COMMUNITY)
Admission: RE | Admit: 2016-12-06 | Discharge: 2016-12-06 | Disposition: A | Discharge status: Home / Self Care | Payer: BLUE CROSS/BLUE SHIELD | Source: Ambulatory Visit | Attending: Interventional Radiology | Admitting: Interventional Radiology

## 2016-12-06 ENCOUNTER — Ambulatory Visit
Admission: RE | Admit: 2016-12-06 | Discharge: 2016-12-06 | Disposition: A | Discharge status: Home / Self Care | Payer: BLUE CROSS/BLUE SHIELD | Source: Ambulatory Visit | Attending: Interventional Radiology | Admitting: Interventional Radiology

## 2016-12-06 DIAGNOSIS — D259 Leiomyoma of uterus, unspecified: Secondary | ICD-10-CM

## 2016-12-06 MED ORDER — GADOBENATE DIMEGLUMINE 529 MG/ML IV SOLN
20.0000 mL | Freq: Once | INTRAVENOUS | Status: AC | PRN
  Administered 2016-12-06: 20 mL via INTRAVENOUS

## 2016-12-06 NOTE — Progress Notes (Signed)
Referring Physician(s): Dr Hale Bogus  Chief Complaint: The patient is seen in follow up today s/p  05/05/16: Successful left uterine artery embolization to treat symptomatic uterine fibroids. No significant fibroid supply was identified on the right side. Adequate occlusion of left uterine branch vessels supplying uterine fibroids was achieved with microsphere particle embolization.   History of present illness:  Presents today 7 months post procedure Doing great Denies ever having pain; cramps after procedure Denies fever/chills Last period 12/01/16---normal timing; slightly shorter. States she can tell her abdomen is less swollen; less urinary urgency; less abd pressure and bloating.  MRI today: Decrease in overall uterine volume, and decreased size of dominant pedunculated fibroid arising from the fundus. Multiple other smaller fibroids show no significant change in size    Past Medical History:  Diagnosis Date  . Anemia   . Fibroid     Past Surgical History:  Procedure Laterality Date  . BREAST SURGERY Left 2003, 2005   Removal of fibroma  . IR GENERIC HISTORICAL  05/05/2016   IR EMBO TUMOR ORGAN ISCHEMIA INFARCT INC GUIDE ROADMAPPING 05/05/2016 [name], MD [facility]  . IR GENERIC HISTORICAL  05/05/2016   IR ANGIOGRAM PELVIS SELECTIVE OR SUPRASELECTIVE 05/05/2016 [name], MD [facility]  . IR GENERIC HISTORICAL  05/05/2016   IR ANGIOGRAM PELVIS SELECTIVE OR SUPRASELECTIVE 05/05/2016 [name], MD [facility]  . IR GENERIC HISTORICAL  05/05/2016   IR US GUIDE VASC ACCESS RIGHT 05/05/2016 [name], MD [facility]  . IR GENERIC HISTORICAL  05/05/2016   IR ANGIOGRAM SELECTIVE EACH ADDITIONAL VESSEL 05/05/2016 [name], MD [facility]  . IR GENERIC HISTORICAL  02/16/2016   IR RADIOLOGIST EVAL & MGMT 02/16/2016 [name], MD [facility]    Allergies: Latex and Penicillins  Medications: Prior to Admission medications     Medication Sig Start Date End Date Taking? Authorizing Provider  ibuprofen (ADVIL,MOTRIN) 200 MG tablet Take 400 mg by mouth every 6 (six) hours as needed for moderate pain.   Yes Historical Provider, MD  loratadine (CLARITIN) 10 MG tablet Take 10 mg by mouth daily.   Yes Historical Provider, MD     Family History  Problem Relation Age of Onset  . Hypertension Father   . Prostate cancer Father   . Breast cancer Maternal Aunt   . Breast cancer Paternal Aunt   . Diabetes Maternal Grandmother   . Cancer Maternal Aunt     Social History   Social History  . Marital status: Married    Spouse name: N/A  . Number of children: N/A  . Years of education: N/A   Social History Main Topics  . Smoking status: Never Smoker  . Smokeless tobacco: Never Used  . Alcohol use 1.2 oz/week    2 Glasses of wine per week  . Drug use: No  . Sexual activity: Yes    Birth control/ protection: None   Other Topics Concern  . Not on file   Social History Narrative  . No narrative on file     Vital Signs: BP 128/79 (BP Location: Left Arm, Patient Position: Sitting, Cuff Size: Normal)   Pulse 87   Temp 98.4 F (36.9 C) (Oral)   Resp 15   Ht 5\' 5"  (1.651 m)   Wt 180 lb (81.6 kg)   LMP 12/01/2016   SpO2 99%   BMI 29.95 kg/m   Physical Exam  Constitutional: She is oriented to person, place, and time. She appears well-nourished.  Cardiovascular: Normal rate, regular rhythm and normal heart sounds.   Pulmonary/Chest: Effort normal and breath sounds normal.  Abdominal: Soft. Bowel sounds are normal. She exhibits no distension. There is no tenderness.  Musculoskeletal: Normal range of motion.  Neurological: She is alert and oriented to person, place, and time.  Skin: Skin is warm and dry.  Psychiatric: She has a normal mood and affect. Her behavior is normal. Judgment and thought content normal.  Nursing note and vitals reviewed.   Imaging: Mr Pelvis W Wo Contrast  Result Date:  12/06/2016 CLINICAL DATA:  Followup uterine fibroids. 7 months status post uterine artery embolization. EXAM: MRI PELVIS WITHOUT AND WITH CONTRAST TECHNIQUE: Multiplanar multisequence MR imaging of the pelvis was performed both before and after administration of intravenous contrast. CONTRAST:  42mL MULTIHANCE GADOBENATE DIMEGLUMINE 529 MG/ML IV SOLN COMPARISON:  02/18/2016 FINDINGS: Urinary Tract: Unremarkable urinary bladder. Bowel: Unremarkable pelvic bowel loops. Vascular/Lymphatic: No pathologically enlarged lymph nodes or other significant abnormality. Reproductive: Uterus: Measures 19.3 by 8.9 x 11.5 cm (volume = 1000 cm^3, compared to 1306 cc previously). Multiple uterine fibroids are again seen. Dominant pedunculated subserosal fibroid extending superiorly from the uterine fundus measures 11.7 by 8.4 by 11.5 cm, compared to 13.1 by 9.3 by 13.1 cm. This dominant fibroid no longer shows evidence of contrast enhancement. Another pedunculated subserosal fibroid arising from the left anterior corpus measures 5.5 cm in maximum diameter, without significant change in size compared with 5.4 cm on previous study. Multiple other smaller intramural fibroids in the uterine fundus and corpus show no significant change in size ranging from less than 1 cm to 3 cm in diameter. All of these non dominant fibroids show residual contrast enhancement, which is stable or decreased compared to prior exam. Thin endometrium.  Normal appearance of cervix. Right ovary: Appears normal.  No mass identified. Left ovary:  Appears normal.  No mass identified. Other: No abnormal free fluid. Musculoskeletal:  Unremarkable. IMPRESSION: Decrease in overall uterine volume, and decreased size of dominant pedunculated fibroid arising from the fundus. Multiple other smaller fibroids show no significant change in size. Normal appearance of both ovaries. No evidence of adnexal pathology or other acute findings. Electronically Signed   By: Earle Gell M.D.   On: 12/06/2016 12:04    Labs:  CBC:  Recent Labs  05/05/16 0755 05/06/16 0338  WBC 4.7 6.6  HGB 13.5 12.0  HCT 39.8 36.3  PLT 247 237    COAGS:  Recent Labs  05/05/16 0755  INR 0.91  APTT 36    BMP:  Recent Labs  05/05/16 0755 05/06/16 0338  NA 138 137  K 3.6 3.8  CL 106 107  CO2 25 24  GLUCOSE 88 108*  BUN 17 15  CALCIUM 9.6 8.8*  CREATININE 0.99 0.83  GFRNONAA >60 >60  GFRAA >60 >60    LIVER FUNCTION TESTS: No results for input(s): BILITOT, AST, ALT, ALKPHOS, PROT, ALBUMIN in the last 8760 hours.  Assessment:  Left uterine fibroid embolization 04/2016 Doing well and without complaints MRI reviewed with pt per Dr Kathlene Cote:  good response No further follow up needed unless pt becomes symptomatic from remaining smaller fibroids  Signed: Daysy Santini A 12/06/2016, 3:01 PM   Please refer to Dr. Kathlene Cote attestation of this note for management and plan.

## 2017-02-06 ENCOUNTER — Telehealth: Payer: Self-pay | Admitting: *Deleted

## 2017-02-06 NOTE — Telephone Encounter (Signed)
Patient in 08 recall for 01/2017. Left patient a message to call and schedule AEX /PAP -eh

## 2017-02-14 NOTE — Telephone Encounter (Signed)
Patient scheduled for 03-20-17

## 2017-03-20 ENCOUNTER — Encounter: Payer: Self-pay | Admitting: Obstetrics & Gynecology

## 2017-03-20 ENCOUNTER — Ambulatory Visit (INDEPENDENT_AMBULATORY_CARE_PROVIDER_SITE_OTHER): Payer: BLUE CROSS/BLUE SHIELD | Admitting: Obstetrics & Gynecology

## 2017-03-20 VITALS — BP 106/62 | HR 84 | Resp 12 | Ht 64.25 in | Wt 200.5 lb

## 2017-03-20 DIAGNOSIS — Z01419 Encounter for gynecological examination (general) (routine) without abnormal findings: Secondary | ICD-10-CM

## 2017-03-20 DIAGNOSIS — R899 Unspecified abnormal finding in specimens from other organs, systems and tissues: Secondary | ICD-10-CM

## 2017-03-20 DIAGNOSIS — Z0000 Encounter for general adult medical examination without abnormal findings: Secondary | ICD-10-CM

## 2017-03-20 NOTE — Patient Instructions (Signed)
[facility] [address] [place], [place] [address]  [email or web address]  [phone]   Nanetta Batty, MD [place], [place], [place] [address]  [email or web address]  [phone]

## 2017-03-20 NOTE — Progress Notes (Signed)
46 y.o. G32P0020 Married [place] F here for annual exam.  Doing well.  Had a uterine artery embolization.  Did really well with the procedure.   The embolization did not work as well as typical because of the vascular supply to the uterus.  Cycles are regular.  Flow lasts 3 1/2 days.    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  Needs PCP locally.  Would like recommendations.  Patient's last menstrual period was 03/12/2017.          Sexually active: Yes.    The current method of family planning is none.    Exercising: No.  The patient does not participate in regular exercise at present. Smoker:  no  Health Maintenance: Pap:  01/20/16 ASCUS, HR HPV negative  History of abnormal Pap:  no MMG: 4/15 (reviewed in EPIC) Colonoscopy:  never BMD:   never TDaP:  2009  Pneumonia vaccine(s):  never Zostavax/shingrix:   never Hep C testing: not indicated  Screening Labs: discuss with provider, Hb today: same   reports that she has never smoked. She has never used smokeless tobacco. She reports that she drinks about 1.2 oz of alcohol per week . She reports that she does not use drugs.  Past Medical History:  Diagnosis Date  . Anemia   . Fibroid     Past Surgical History:  Procedure Laterality Date  . BREAST SURGERY Left 2003, 2005   Removal of fibroma  . IR GENERIC HISTORICAL  05/05/2016   IR EMBO TUMOR ORGAN ISCHEMIA INFARCT INC GUIDE ROADMAPPING 05/05/2016 [name], MD [facility]  . IR GENERIC HISTORICAL  05/05/2016   IR ANGIOGRAM PELVIS SELECTIVE OR SUPRASELECTIVE 05/05/2016 [name], MD [facility]  . IR GENERIC HISTORICAL  05/05/2016   IR ANGIOGRAM PELVIS SELECTIVE OR SUPRASELECTIVE 05/05/2016 [name], MD [facility]  . IR GENERIC HISTORICAL  05/05/2016   IR US GUIDE VASC ACCESS RIGHT 05/05/2016 [name], MD [facility]  . IR GENERIC HISTORICAL  05/05/2016   IR ANGIOGRAM SELECTIVE EACH ADDITIONAL VESSEL 05/05/2016 [name], MD [facility]  . IR  GENERIC HISTORICAL  02/16/2016   IR RADIOLOGIST EVAL & MGMT 02/16/2016 [name], MD [facility]    Current Outpatient Prescriptions  Medication Sig Dispense Refill  . ibuprofen (ADVIL,MOTRIN) 200 MG tablet Take 400 mg by mouth every 6 (six) hours as needed for moderate pain.     No current facility-administered medications for this visit.     Family History  Problem Relation Age of Onset  . Hypertension Father   . Prostate cancer Father   . Breast cancer Maternal Aunt   . Breast cancer Paternal Aunt   . Diabetes Maternal Grandmother   . Cancer Maternal Aunt     ROS:  Pertinent items are noted in HPI.  Otherwise, a comprehensive ROS was negative.  Exam:   BP 106/62 (BP Location: Right Arm, Patient Position: Sitting, Cuff Size: Normal)   Pulse 84   Resp 12   Ht 5' 4.25" (1.632 m)   Wt 200 lb 8 oz (90.9 kg)   LMP 03/12/2017   BMI 34.15 kg/m   Weight    Height: 5' 4.25" (163.2 cm)  Ht Readings from Last 3 Encounters:  03/20/17 5' 4.25" (1.632 m)  12/06/16 5\' 5"  (1.651 m)  05/05/16 5\' 5"  (1.651 m)    General appearance: alert, cooperative and appears stated age Head: Normocephalic, without obvious abnormality, atraumatic Neck: no adenopathy, supple, symmetrical, trachea midline and thyroid  normal to inspection and palpation Lungs: clear to auscultation bilaterally Breasts: normal appearance, no masses or tenderness Heart: regular rate and rhythm Abdomen: soft, non-tender; bowel sounds normal; no masses,  no organomegaly Extremities: extremities normal, atraumatic, no cyanosis or edema Skin: Skin color, texture, turgor normal. No rashes or lesions Lymph nodes: Cervical, supraclavicular, and axillary nodes normal. No abnormal inguinal nodes palpated Neurologic: Grossly normal   Pelvic: External genitalia:  no lesions              Urethra:  normal appearing urethra with no masses, tenderness or lesions              Bartholins and Skenes: normal                  Vagina: normal appearing vagina with normal color and discharge, no lesions              Cervix: no lesions              Pap taken: No. Bimanual Exam:  Uterus:  enlarged, 18 weeks size, decreased in sized from prior year exam              Adnexa: no mass, fullness, tenderness               Rectovaginal: Confirms               Anus:  normal sphincter tone, no lesions  Chaperone was present for exam.  A:  Well Woman with normal exam Enlarged uterus with known fibroids, s/p [place] with only partial response due to blood supply to uterus on one side primarily from ovarian artery (although she is very satisfied, still, with results).  Has MRI and IR follow up scheduled H/O breast fibroma  P:   Mammogram guidelines reviewed pap smear and HR HPV in 2020 Information regarding new colonoscopy guidelines reviewed CBC, CMP, Lipids, TSH, Vit D will be obtained Pt aware Tdap update due next year Names for possible local PCPs given  return annually or prn

## 2017-03-21 LAB — CBC
HEMATOCRIT: 40 % (ref 34.0–46.6)
HEMOGLOBIN: 12.8 g/dL (ref 11.1–15.9)
MCH: 25.1 pg — AB (ref 26.6–33.0)
MCHC: 32 g/dL (ref 31.5–35.7)
MCV: 79 fL (ref 79–97)
Platelets: 277 10*3/uL (ref 150–379)
RBC: 5.09 x10E6/uL (ref 3.77–5.28)
RDW: 14.7 % (ref 12.3–15.4)
WBC: 4.1 10*3/uL (ref 3.4–10.8)

## 2017-03-21 LAB — COMPREHENSIVE METABOLIC PANEL
ALBUMIN: 4.3 g/dL (ref 3.5–5.5)
ALK PHOS: 66 IU/L (ref 39–117)
ALT: 17 IU/L (ref 0–32)
AST: 21 IU/L (ref 0–40)
Albumin/Globulin Ratio: 1.3 (ref 1.2–2.2)
BUN / CREAT RATIO: 12 (ref 9–23)
BUN: 12 mg/dL (ref 6–24)
Bilirubin Total: 0.2 mg/dL (ref 0.0–1.2)
CO2: 25 mmol/L (ref 20–29)
Calcium: 9.8 mg/dL (ref 8.7–10.2)
Chloride: 100 mmol/L (ref 96–106)
Creatinine, Ser: 1.02 mg/dL — ABNORMAL HIGH (ref 0.57–1.00)
GFR calc Af Amer: 76 mL/min/{1.73_m2} (ref >59)
GFR calc non Af Amer: 66 mL/min/{1.73_m2} (ref >59)
GLOBULIN, TOTAL: 3.3 g/dL (ref 1.5–4.5)
Glucose: 85 mg/dL (ref 65–99)
Potassium: 4.1 mmol/L (ref 3.5–5.2)
SODIUM: 138 mmol/L (ref 134–144)
Total Protein: 7.6 g/dL (ref 6.0–8.5)

## 2017-03-21 LAB — TSH: TSH: 1.72 u[IU]/mL (ref 0.450–4.500)

## 2017-03-21 LAB — LIPID PANEL
CHOL/HDL RATIO: 3.9 ratio (ref 0.0–4.4)
Cholesterol, Total: 270 mg/dL — ABNORMAL HIGH (ref 100–199)
HDL: 69 mg/dL (ref >39)
LDL CALC: 187 mg/dL — AB (ref 0–99)
Triglycerides: 70 mg/dL (ref 0–149)
VLDL Cholesterol Cal: 14 mg/dL (ref 5–40)

## 2017-03-21 LAB — VITAMIN D 25 HYDROXY (VIT D DEFICIENCY, FRACTURES): VIT D 25 HYDROXY: 18.7 ng/mL — AB (ref 30.0–100.0)

## 2017-03-23 NOTE — Addendum Note (Signed)
Addended by: Megan Salon on: 03/23/2017 03:59 PM   Modules accepted: Orders

## 2017-03-26 ENCOUNTER — Telehealth: Payer: Self-pay | Admitting: *Deleted

## 2017-03-26 NOTE — Telephone Encounter (Signed)
-----   Message from Megan Salon, MD sent at 03/23/2017  3:57 PM EDT ----- Please let pt know her CBC is fine.  Her CMP showed one mildly elevated kidney function test.  I'd like to repeat this in one month.  Order has been placed.  Her total cholestrol was 270 and her LDLs were 187.  I don't think she was fasting for this.  If she wants to repeat this with the CMP in one month, that is fine.  Just make sure to schedule the blood work as fasting.  Orders placed for both tests.  Her thyroid is normal.  Her Vit D is almost 19.  She needs to take 1000-2000 IU vit d daily and have the level retested next year.  Thanks.

## 2017-03-26 NOTE — Telephone Encounter (Signed)
Left voice mail to call back 

## 2017-03-27 NOTE — Telephone Encounter (Signed)
Pt notified in result note.  Closing encounter. 

## 2017-04-24 ENCOUNTER — Other Ambulatory Visit (INDEPENDENT_AMBULATORY_CARE_PROVIDER_SITE_OTHER): Payer: BLUE CROSS/BLUE SHIELD

## 2017-04-24 DIAGNOSIS — R899 Unspecified abnormal finding in specimens from other organs, systems and tissues: Secondary | ICD-10-CM | POA: Diagnosis not present

## 2017-04-25 LAB — BASIC METABOLIC PANEL
BUN/Creatinine Ratio: 13 (ref 9–23)
BUN: 14 mg/dL (ref 6–24)
CALCIUM: 9.2 mg/dL (ref 8.7–10.2)
CHLORIDE: 104 mmol/L (ref 96–106)
CO2: 22 mmol/L (ref 20–29)
Creatinine, Ser: 1.04 mg/dL — ABNORMAL HIGH (ref 0.57–1.00)
GFR calc non Af Amer: 65 mL/min/{1.73_m2} (ref >59)
GFR, EST AFRICAN AMERICAN: 74 mL/min/{1.73_m2} (ref >59)
Glucose: 87 mg/dL (ref 65–99)
Potassium: 4.3 mmol/L (ref 3.5–5.2)
Sodium: 139 mmol/L (ref 134–144)

## 2017-04-25 LAB — LIPID PANEL
Chol/HDL Ratio: 3.7 ratio (ref 0.0–4.4)
Cholesterol, Total: 215 mg/dL — ABNORMAL HIGH (ref 100–199)
HDL: 58 mg/dL (ref >39)
LDL Calculated: 144 mg/dL — ABNORMAL HIGH (ref 0–99)
TRIGLYCERIDES: 67 mg/dL (ref 0–149)
VLDL Cholesterol Cal: 13 mg/dL (ref 5–40)

## 2017-05-14 ENCOUNTER — Telehealth: Payer: Self-pay | Admitting: *Deleted

## 2017-05-14 NOTE — Telephone Encounter (Signed)
Spoke with patient and gave results and recommendations per Dr. Sabra Heck. Patient voiced understanding -eh

## 2017-05-14 NOTE — Telephone Encounter (Signed)
Patient returning your call.

## 2017-05-14 NOTE — Telephone Encounter (Signed)
-----   Message from Megan Salon, MD sent at 05/11/2017  2:08 PM EDT ----- Please let pt know her cholesterol is better.  Total 215 and LDLs 144.  Was 270 and 187.  Will check yearly (and fasting).  BMP showed kidney function test to be mildly elevated again.  GFR is normal (this is estimate of actual filtering rate of the kidney).  If still elevated in a year, will need to check a renal ultrasound but it is ok to wait as this is very mild.  Thanks.

## 2017-05-14 NOTE — Telephone Encounter (Signed)
Left message to call regarding results -eh 

## 2017-11-05 DIAGNOSIS — R05 Cough: Secondary | ICD-10-CM | POA: Diagnosis not present

## 2018-06-04 ENCOUNTER — Telehealth: Payer: Self-pay | Admitting: *Deleted

## 2018-06-04 ENCOUNTER — Other Ambulatory Visit (HOSPITAL_COMMUNITY)
Admission: RE | Admit: 2018-06-04 | Discharge: 2018-06-04 | Disposition: A | Discharge status: Home / Self Care | Payer: BLUE CROSS/BLUE SHIELD | Source: Ambulatory Visit | Attending: Obstetrics & Gynecology | Admitting: Obstetrics & Gynecology

## 2018-06-04 ENCOUNTER — Ambulatory Visit (INDEPENDENT_AMBULATORY_CARE_PROVIDER_SITE_OTHER): Payer: BLUE CROSS/BLUE SHIELD | Admitting: Obstetrics & Gynecology

## 2018-06-04 ENCOUNTER — Other Ambulatory Visit: Payer: Self-pay

## 2018-06-04 ENCOUNTER — Encounter: Payer: Self-pay | Admitting: Obstetrics & Gynecology

## 2018-06-04 ENCOUNTER — Other Ambulatory Visit: Payer: Self-pay | Admitting: Obstetrics & Gynecology

## 2018-06-04 ENCOUNTER — Encounter

## 2018-06-04 VITALS — BP 102/60 | HR 72 | Resp 16 | Ht 64.5 in | Wt 181.4 lb

## 2018-06-04 DIAGNOSIS — Z01419 Encounter for gynecological examination (general) (routine) without abnormal findings: Secondary | ICD-10-CM

## 2018-06-04 DIAGNOSIS — Z124 Encounter for screening for malignant neoplasm of cervix: Secondary | ICD-10-CM | POA: Insufficient documentation

## 2018-06-04 DIAGNOSIS — Z23 Encounter for immunization: Secondary | ICD-10-CM | POA: Diagnosis not present

## 2018-06-04 DIAGNOSIS — Z1231 Encounter for screening mammogram for malignant neoplasm of breast: Secondary | ICD-10-CM

## 2018-06-04 DIAGNOSIS — Z0000 Encounter for general adult medical examination without abnormal findings: Secondary | ICD-10-CM | POA: Diagnosis not present

## 2018-06-04 DIAGNOSIS — Z1211 Encounter for screening for malignant neoplasm of colon: Secondary | ICD-10-CM

## 2018-06-04 NOTE — Telephone Encounter (Signed)
Called [facility]. Scheduled MMG for Oct 18th arrive by 7:50am. appt at 8:10am.  Called patient and left voicemail with appt information.

## 2018-06-04 NOTE — Progress Notes (Signed)
47 y.o. G8P0020 Married [name] American female here for annual exam.  Still having cycles.  Flow is four days and every 28 days.  Flow is average.  Does not pass clots.  Does still have a lot of cramping.  Taking Advil.    Patient's last menstrual period was 05/28/2018 (exact date).          Sexually active: Yes.    The current method of family planning is none.    Exercising: Yes.    cardio, strength, stretching Smoker:  no  Health Maintenance: Pap:  01/20/16 ASCUS. HR HPV:neg  History of abnormal Pap:  yes MMG:  01/13/14 BIRADS1:neg  Colonoscopy:  Never BMD:   Never TDaP:  2009  Screening Labs: Will come back fasting    reports that she has never smoked. She has never used smokeless tobacco. She reports that she drinks about 2.0 standard drinks of alcohol per week. She reports that she does not use drugs.  Past Medical History:  Diagnosis Date  . Anemia   . Fibroid     Past Surgical History:  Procedure Laterality Date  . BREAST SURGERY Left 2003, 2005   Removal of fibroma  . IR GENERIC HISTORICAL  05/05/2016   IR EMBO TUMOR ORGAN ISCHEMIA INFARCT INC GUIDE ROADMAPPING 05/05/2016 [name], MD [facility]  . IR GENERIC HISTORICAL  05/05/2016   IR ANGIOGRAM PELVIS SELECTIVE OR SUPRASELECTIVE 05/05/2016 [name], MD [facility]  . IR GENERIC HISTORICAL  05/05/2016   IR ANGIOGRAM PELVIS SELECTIVE OR SUPRASELECTIVE 05/05/2016 [name], MD [facility]  . IR GENERIC HISTORICAL  05/05/2016   IR US GUIDE VASC ACCESS RIGHT 05/05/2016 [name], MD [facility]  . IR GENERIC HISTORICAL  05/05/2016   IR ANGIOGRAM SELECTIVE EACH ADDITIONAL VESSEL 05/05/2016 [name], MD [facility]  . IR GENERIC HISTORICAL  02/16/2016   IR RADIOLOGIST EVAL & MGMT 02/16/2016 [name], MD [facility]    Current Outpatient Medications  Medication Sig Dispense Refill  . ibuprofen (ADVIL,MOTRIN) 200 MG tablet Take 400 mg by mouth every 6 (six) hours as needed  for moderate pain.     No current facility-administered medications for this visit.     Family History  Problem Relation Age of Onset  . Hypertension Father   . Prostate cancer Father   . Breast cancer Maternal Aunt   . Breast cancer Paternal Aunt   . Diabetes Maternal Grandmother   . Cancer Maternal Aunt     Review of Systems  Gastrointestinal: Positive for abdominal pain.  Skin:       New or changed mole/lump   Neurological: Positive for headaches.  All other systems reviewed and are negative.   Exam:   BP 102/60 (BP Location: Right Arm, Patient Position: Sitting, Cuff Size: Large)   Pulse 72   Resp 16   Ht 5' 4.5" (1.638 m)   Wt 181 lb 6.4 oz (82.3 kg)   LMP 05/28/2018 (Exact Date)   BMI 30.66 kg/m   Height: 5' 4.5" (163.8 cm)  Ht Readings from Last 3 Encounters:  06/04/18 5' 4.5" (1.638 m)  03/20/17 5' 4.25" (1.632 m)  12/06/16 5\' 5"  (1.651 m)    General appearance: alert, cooperative and appears stated age Head: Normocephalic, without obvious abnormality, atraumatic Neck: no adenopathy, supple, symmetrical, trachea midline and thyroid normal to inspection and palpation Lungs: clear to auscultation bilaterally Breasts: normal appearance, no masses or tenderness Heart: regular rate and rhythm Abdomen: soft, non-tender;  bowel sounds normal; no masses,  no organomegaly Extremities: extremities normal, atraumatic, no cyanosis or edema Skin: Skin color, texture, turgor normal. No rashes or lesions Lymph nodes: Cervical, supraclavicular, and axillary nodes normal. No abnormal inguinal nodes palpated Neurologic: Grossly normal   Pelvic: External genitalia:  no lesions              Urethra:  normal appearing urethra with no masses, tenderness or lesions              Bartholins and Skenes: normal                 Vagina: normal appearing vagina with normal color and discharge, no lesions              Cervix: no lesions              Pap taken: Yes.   Bimanual Exam:   Uterus:  enlarged,  weeks size about 18, firm c/w fibroids, minimally mobile due to size              Adnexa: normal adnexa               Rectovaginal: Confirms               Anus:  normal sphincter tone, no lesions  Chaperone was present for exam.  A:  Well Woman with normal exam H/o [place] due to significantly enlarged uterus H/O left breast fibroadenoma  P:   Mammogram over due.  Pt aware.  Will schedule for her. pap smear with HR HPV obtained today Pt will return for fasting labs.  Orders placed. IFOB given.  New ACS colon caner screening guidelines reviewed. Tdap will be updated today return annually or prn

## 2018-06-06 ENCOUNTER — Ambulatory Visit: Payer: BLUE CROSS/BLUE SHIELD | Admitting: Obstetrics & Gynecology

## 2018-06-06 LAB — CYTOLOGY - PAP
DIAGNOSIS: NEGATIVE
HPV (WINDOPATH): NOT DETECTED

## 2018-06-07 ENCOUNTER — Other Ambulatory Visit (INDEPENDENT_AMBULATORY_CARE_PROVIDER_SITE_OTHER): Payer: BLUE CROSS/BLUE SHIELD

## 2018-06-07 DIAGNOSIS — Z0000 Encounter for general adult medical examination without abnormal findings: Secondary | ICD-10-CM | POA: Diagnosis not present

## 2018-06-07 DIAGNOSIS — R7989 Other specified abnormal findings of blood chemistry: Secondary | ICD-10-CM

## 2018-06-07 DIAGNOSIS — E7800 Pure hypercholesterolemia, unspecified: Secondary | ICD-10-CM

## 2018-06-08 LAB — CBC
Hematocrit: 42 % (ref 34.0–46.6)
Hemoglobin: 13.3 g/dL (ref 11.1–15.9)
MCH: 27.1 pg (ref 26.6–33.0)
MCHC: 31.7 g/dL (ref 31.5–35.7)
MCV: 86 fL (ref 79–97)
Platelets: 247 10*3/uL (ref 150–450)
RBC: 4.91 x10E6/uL (ref 3.77–5.28)
RDW: 14.8 % (ref 12.3–15.4)
WBC: 4.1 10*3/uL (ref 3.4–10.8)

## 2018-06-08 LAB — COMPREHENSIVE METABOLIC PANEL
ALK PHOS: 54 IU/L (ref 39–117)
ALT: 14 IU/L (ref 0–32)
AST: 19 IU/L (ref 0–40)
Albumin/Globulin Ratio: 1.5 (ref 1.2–2.2)
Albumin: 4.3 g/dL (ref 3.5–5.5)
BUN/Creatinine Ratio: 15 (ref 9–23)
BUN: 15 mg/dL (ref 6–24)
Bilirubin Total: 0.2 mg/dL (ref 0.0–1.2)
CO2: 25 mmol/L (ref 20–29)
CREATININE: 0.97 mg/dL (ref 0.57–1.00)
Calcium: 9.6 mg/dL (ref 8.7–10.2)
Chloride: 101 mmol/L (ref 96–106)
GFR calc Af Amer: 80 mL/min/{1.73_m2} (ref >59)
GFR calc non Af Amer: 70 mL/min/{1.73_m2} (ref >59)
GLOBULIN, TOTAL: 2.9 g/dL (ref 1.5–4.5)
GLUCOSE: 87 mg/dL (ref 65–99)
Potassium: 4.6 mmol/L (ref 3.5–5.2)
Sodium: 136 mmol/L (ref 134–144)
Total Protein: 7.2 g/dL (ref 6.0–8.5)

## 2018-06-08 LAB — LIPID PANEL
Chol/HDL Ratio: 3.1 ratio (ref 0.0–4.4)
Cholesterol, Total: 250 mg/dL — ABNORMAL HIGH (ref 100–199)
HDL: 81 mg/dL (ref >39)
LDL CALC: 156 mg/dL — AB (ref 0–99)
Triglycerides: 64 mg/dL (ref 0–149)
VLDL CHOLESTEROL CAL: 13 mg/dL (ref 5–40)

## 2018-06-08 LAB — VITAMIN D 25 HYDROXY (VIT D DEFICIENCY, FRACTURES): Vit D, 25-Hydroxy: 14.5 ng/mL — ABNORMAL LOW (ref 30.0–100.0)

## 2018-06-12 ENCOUNTER — Telehealth: Payer: Self-pay | Admitting: *Deleted

## 2018-06-12 NOTE — Telephone Encounter (Signed)
LM for pt to call back.

## 2018-06-12 NOTE — Telephone Encounter (Signed)
-----   Message from Megan Salon, MD sent at 06/12/2018  4:15 PM EDT ----- Please let pt know her Vit D was low.  Needs 50K weekly x 12 weeks and then needs Vit D rechecked.  Her total cholesterol was 250 and LDLS were 998.  I don't think this was fasting, so she does need to repeat this fasting when the Vit D level is rechecked.  Her metabolic panel and CBC were normal.  I did placed future lab orders.

## 2018-06-14 LAB — FECAL OCCULT BLOOD, IMMUNOCHEMICAL: Fecal Occult Bld: NEGATIVE

## 2018-06-14 LAB — SPECIMEN STATUS REPORT

## 2018-06-14 MED ORDER — VITAMIN D (ERGOCALCIFEROL) 1.25 MG (50000 UNIT) PO CAPS: 50000.0000 [IU] | ORAL_CAPSULE | ORAL | 0 refills | Status: DC

## 2018-06-14 NOTE — Telephone Encounter (Signed)
Pt notified. Verbalized understanding. Patient states she was fasting for labs. She will work on diet and exercise. Recheck fasting next year.  Rx sent to pharmacy. Lab appt scheduled   Dr. Lestine Box.  Encounter closed.

## 2018-06-14 NOTE — Telephone Encounter (Signed)
LM for pt to call back. Second attempt  

## 2018-07-05 ENCOUNTER — Ambulatory Visit: Payer: BLUE CROSS/BLUE SHIELD

## 2018-07-29 ENCOUNTER — Ambulatory Visit
Admission: RE | Admit: 2018-07-29 | Discharge: 2018-07-29 | Disposition: A | Discharge status: Home / Self Care | Payer: BLUE CROSS/BLUE SHIELD | Source: Ambulatory Visit | Attending: Obstetrics & Gynecology | Admitting: Obstetrics & Gynecology

## 2018-07-29 DIAGNOSIS — Z1231 Encounter for screening mammogram for malignant neoplasm of breast: Secondary | ICD-10-CM | POA: Diagnosis not present

## 2018-09-04 ENCOUNTER — Other Ambulatory Visit: Payer: Self-pay | Admitting: Obstetrics & Gynecology

## 2018-09-04 NOTE — Telephone Encounter (Signed)
Medication refill request: Vitamin D  Last AEX:  06/04/18 Next AEX: 10/03/19 Last MMG (if hormonal medication request): 07/29/18  Bi-rads 1 Neg  Refill authorized: #[id number] To get her to her 1/3 lab visit

## 2018-09-20 ENCOUNTER — Other Ambulatory Visit (INDEPENDENT_AMBULATORY_CARE_PROVIDER_SITE_OTHER): Payer: BLUE CROSS/BLUE SHIELD

## 2018-09-20 DIAGNOSIS — E559 Vitamin D deficiency, unspecified: Secondary | ICD-10-CM | POA: Diagnosis not present

## 2018-09-20 DIAGNOSIS — E7800 Pure hypercholesterolemia, unspecified: Secondary | ICD-10-CM | POA: Diagnosis not present

## 2018-09-21 LAB — VITAMIN D 25 HYDROXY (VIT D DEFICIENCY, FRACTURES): VIT D 25 HYDROXY: 33.4 ng/mL (ref 30.0–100.0)

## 2019-01-12 IMAGING — MG DIGITAL SCREENING BILATERAL MAMMOGRAM WITH TOMO AND CAD
8 series · 9 of 24 positions shown · non-contrast
Comparison: Previous exam(s).

CLINICAL DATA: Screening.

EXAM:
DIGITAL SCREENING BILATERAL MAMMOGRAM WITH TOMO AND CAD

[L MLO synth-2D]
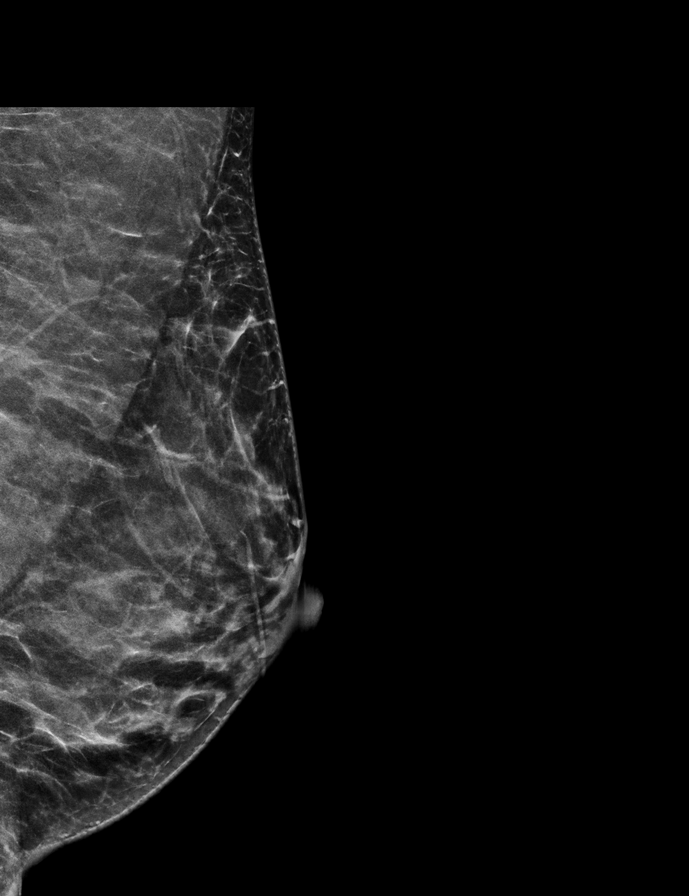

[L CC synth-2D]
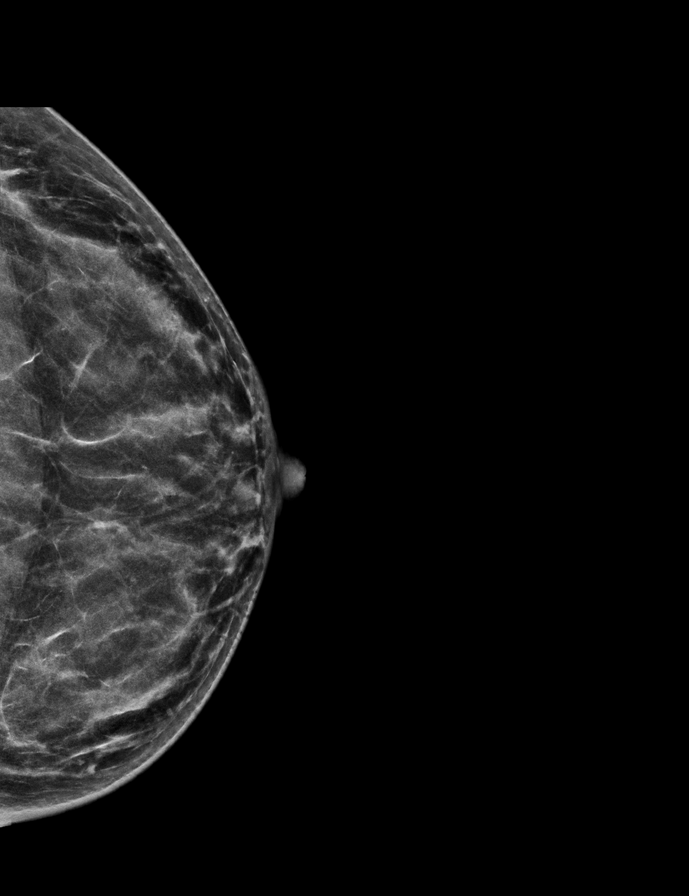

[R MLO synth-2D]
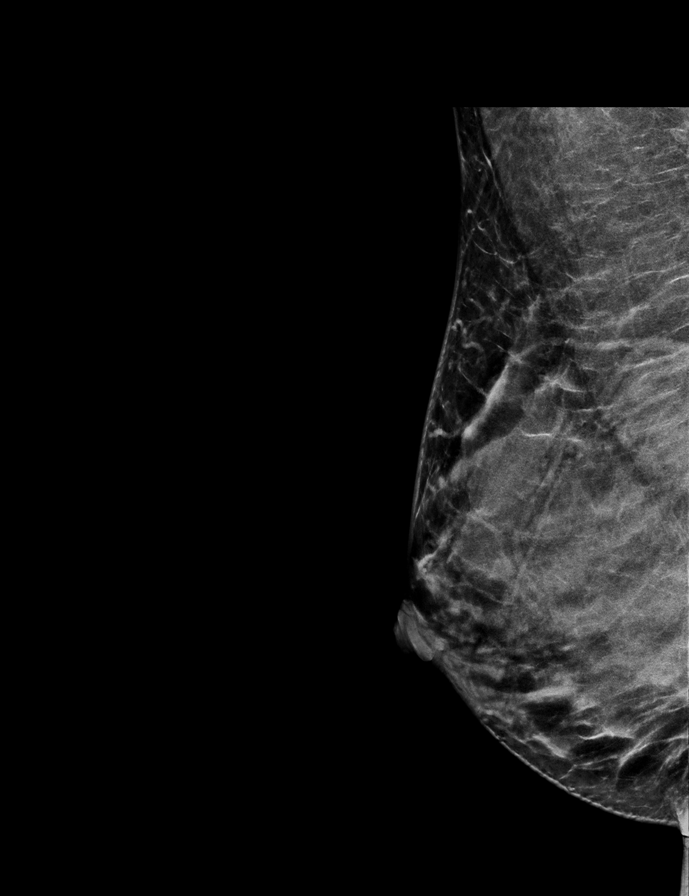

[R CC synth-2D]
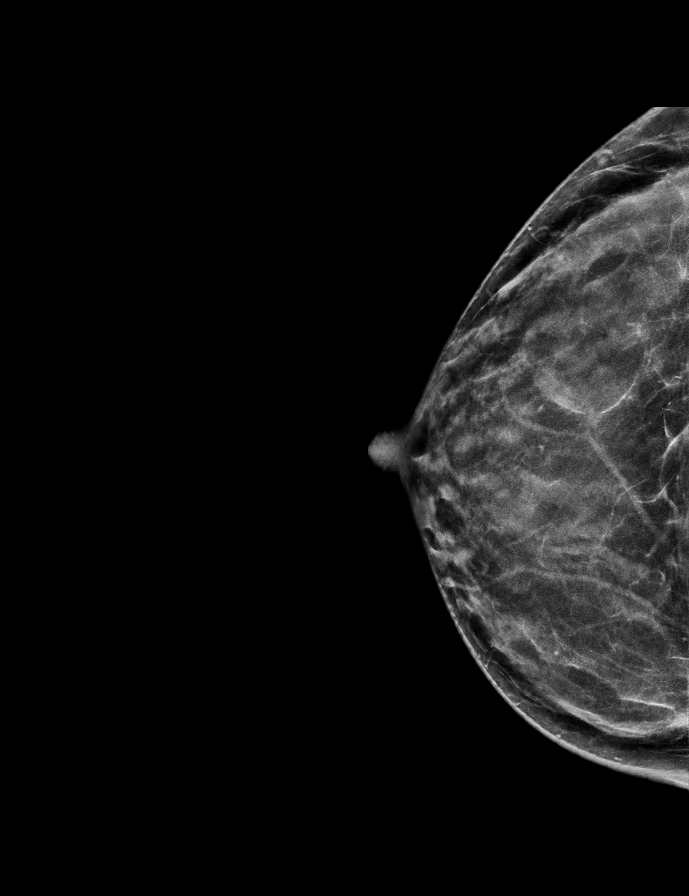

[L MLO tomo · 2 of 54 frames shown]
[frame 18/54]
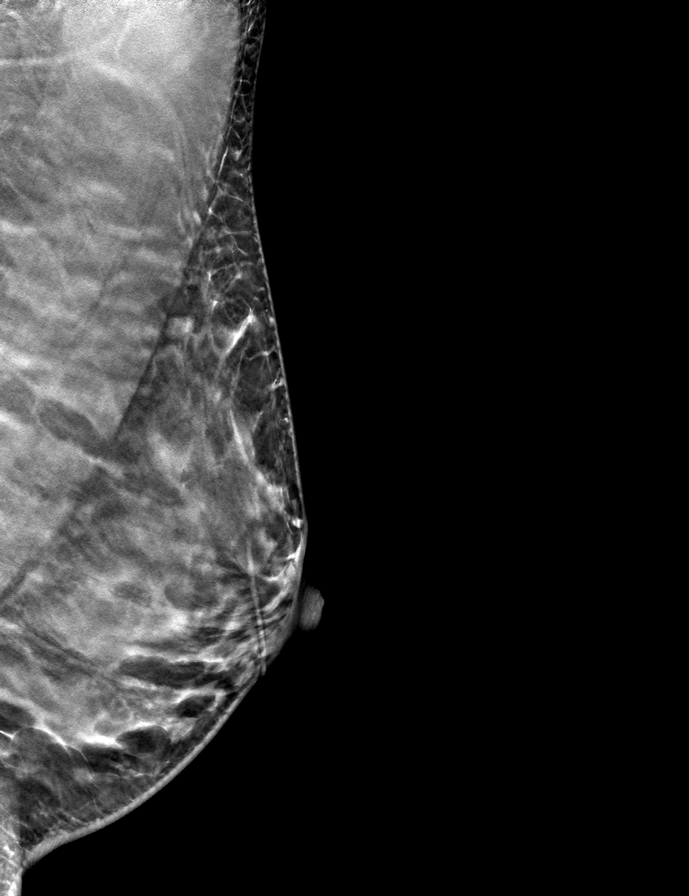
[frame 27/54]
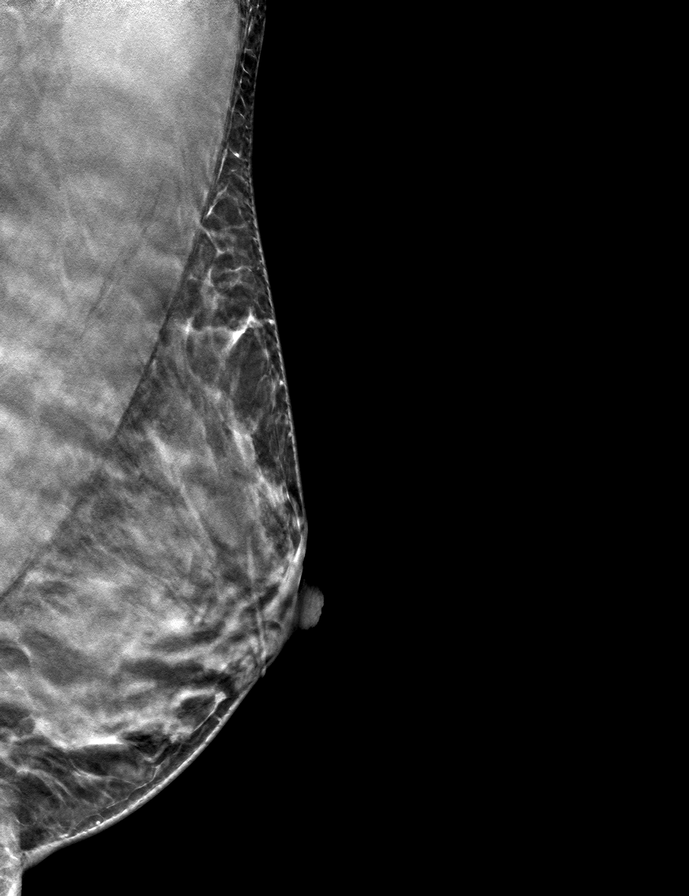

[L CC tomo · tomo slice 26/51.0]
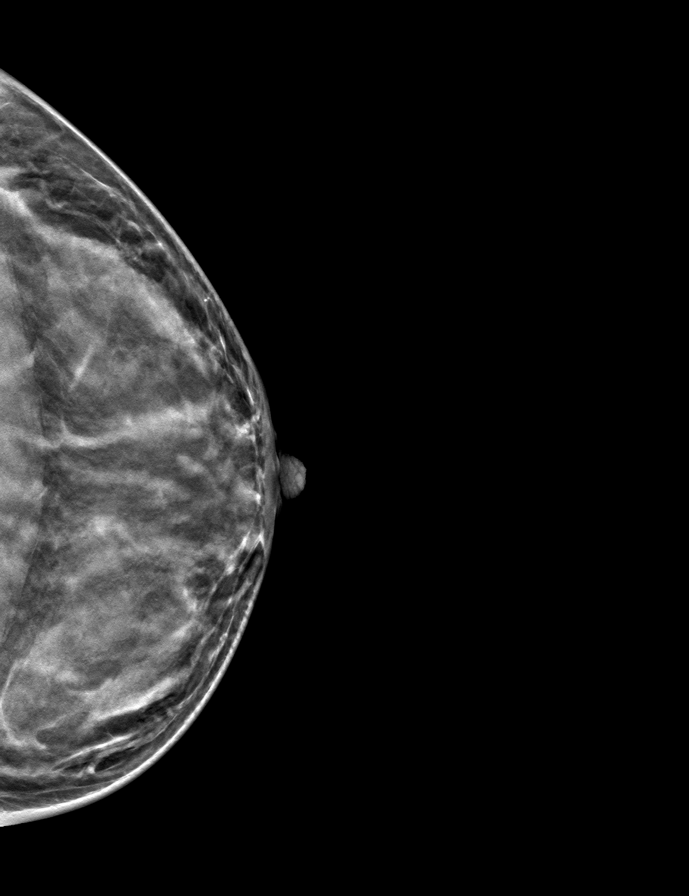

[R CC tomo · tomo slice 26/51.0]
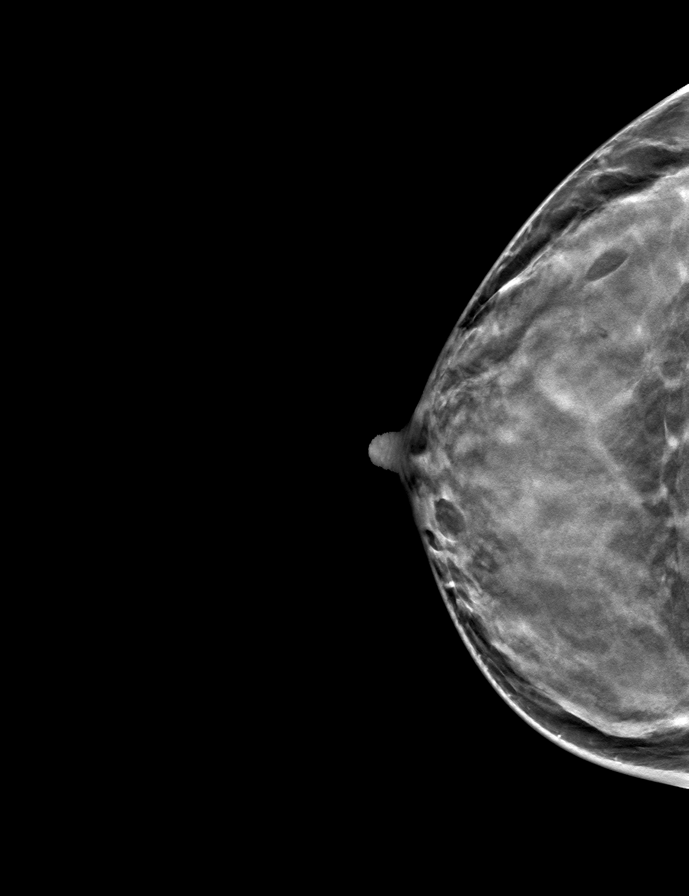

[R MLO tomo · tomo slice 31/60.0]
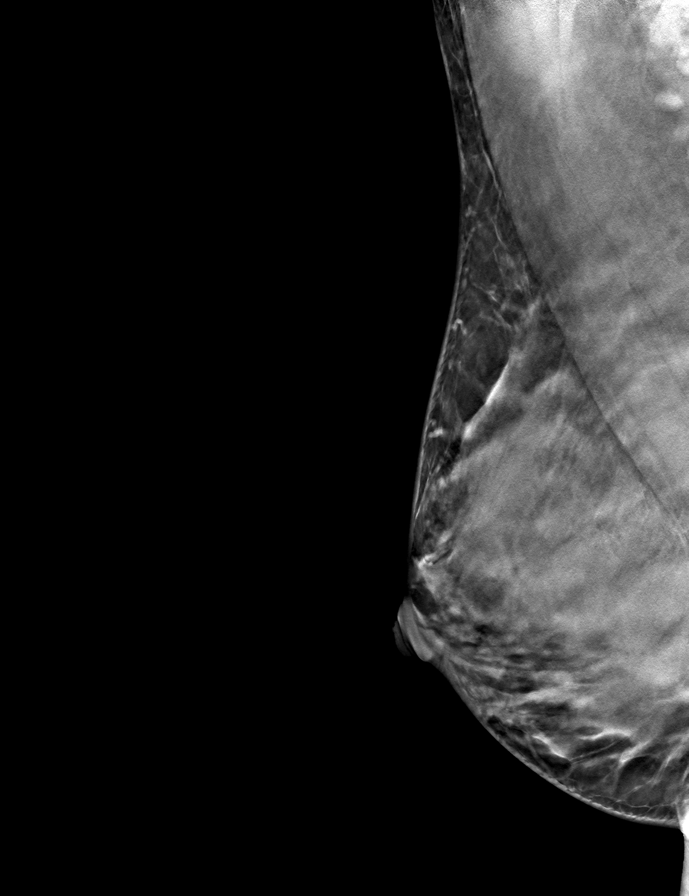

[9 of 24 positions shown; findings below may reference images not displayed]

ACR Breast Density Category c: The breast tissue is heterogeneously
dense, which may obscure small masses.
FINDINGS: There are no findings suspicious for malignancy. Images were
processed with CAD.
IMPRESSION: No mammographic evidence of malignancy. A result letter of this
screening mammogram will be mailed directly to the patient.

RECOMMENDATION:
Screening mammogram in one year. (Code:FT-[id number])

BI-RADS CATEGORY  1: Negative.

## 2019-07-14 DIAGNOSIS — Z03818 Encounter for observation for suspected exposure to other biological agents ruled out: Secondary | ICD-10-CM | POA: Diagnosis not present

## 2019-07-14 DIAGNOSIS — Z20828 Contact with and (suspected) exposure to other viral communicable diseases: Secondary | ICD-10-CM | POA: Diagnosis not present

## 2019-08-01 DIAGNOSIS — Z20828 Contact with and (suspected) exposure to other viral communicable diseases: Secondary | ICD-10-CM | POA: Diagnosis not present

## 2019-09-30 DIAGNOSIS — M25551 Pain in right hip: Secondary | ICD-10-CM | POA: Diagnosis not present

## 2019-09-30 DIAGNOSIS — Z0000 Encounter for general adult medical examination without abnormal findings: Secondary | ICD-10-CM | POA: Diagnosis not present

## 2019-09-30 DIAGNOSIS — E785 Hyperlipidemia, unspecified: Secondary | ICD-10-CM | POA: Diagnosis not present

## 2019-09-30 DIAGNOSIS — J309 Allergic rhinitis, unspecified: Secondary | ICD-10-CM | POA: Diagnosis not present

## 2019-10-03 ENCOUNTER — Ambulatory Visit: Payer: BLUE CROSS/BLUE SHIELD | Admitting: Obstetrics & Gynecology

## 2019-10-31 DIAGNOSIS — E559 Vitamin D deficiency, unspecified: Secondary | ICD-10-CM | POA: Diagnosis not present

## 2019-10-31 DIAGNOSIS — Z833 Family history of diabetes mellitus: Secondary | ICD-10-CM | POA: Diagnosis not present

## 2019-10-31 DIAGNOSIS — Z0000 Encounter for general adult medical examination without abnormal findings: Secondary | ICD-10-CM | POA: Diagnosis not present

## 2019-11-13 DIAGNOSIS — Z20828 Contact with and (suspected) exposure to other viral communicable diseases: Secondary | ICD-10-CM | POA: Diagnosis not present

## 2020-03-05 DIAGNOSIS — B86 Scabies: Secondary | ICD-10-CM | POA: Diagnosis not present

## 2020-04-22 DIAGNOSIS — Z1151 Encounter for screening for human papillomavirus (HPV): Secondary | ICD-10-CM | POA: Diagnosis not present

## 2020-04-22 DIAGNOSIS — Z01419 Encounter for gynecological examination (general) (routine) without abnormal findings: Secondary | ICD-10-CM | POA: Diagnosis not present

## 2020-05-22 DIAGNOSIS — Z20822 Contact with and (suspected) exposure to covid-19: Secondary | ICD-10-CM | POA: Diagnosis not present

## 2020-06-11 DIAGNOSIS — R87810 Cervical high risk human papillomavirus (HPV) DNA test positive: Secondary | ICD-10-CM | POA: Diagnosis not present
